# Patient Record
Sex: Male | Born: 1985 | Race: White | Hispanic: No | Marital: Single | Smoking: Current every day smoker
Health system: Southern US, Community
[De-identification: ages and names within clinical notes are randomized; demographics above are authoritative.]

## PROBLEM LIST (undated history)

## (undated) DIAGNOSIS — K219 Gastro-esophageal reflux disease without esophagitis: Secondary | ICD-10-CM

## (undated) DIAGNOSIS — F209 Schizophrenia, unspecified: Secondary | ICD-10-CM

## (undated) DIAGNOSIS — F319 Bipolar disorder, unspecified: Secondary | ICD-10-CM

## (undated) DIAGNOSIS — R441 Visual hallucinations: Secondary | ICD-10-CM

## (undated) DIAGNOSIS — F1910 Other psychoactive substance abuse, uncomplicated: Secondary | ICD-10-CM

## (undated) DIAGNOSIS — F419 Anxiety disorder, unspecified: Secondary | ICD-10-CM

## (undated) HISTORY — PX: NO PAST SURGERIES: SHX2092

## (undated) HISTORY — PX: MULTIPLE TOOTH EXTRACTIONS: SHX2053

---
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
## 2008-11-28 ENCOUNTER — Emergency Department (HOSPITAL_COMMUNITY): Admission: EM | Admit: 2008-11-28 | Discharge: 2008-11-28 | Payer: Self-pay | Admitting: Emergency Medicine

## 2008-12-15 ENCOUNTER — Emergency Department (HOSPITAL_COMMUNITY): Admission: EM | Admit: 2008-12-15 | Discharge: 2008-12-16 | Payer: Self-pay | Admitting: Emergency Medicine

## 2008-12-17 ENCOUNTER — Emergency Department (HOSPITAL_COMMUNITY): Admission: EM | Admit: 2008-12-17 | Discharge: 2008-12-17 | Payer: Self-pay | Admitting: Emergency Medicine

## 2009-02-19 ENCOUNTER — Emergency Department (HOSPITAL_COMMUNITY): Admission: EM | Admit: 2009-02-19 | Discharge: 2009-02-19 | Payer: Self-pay | Admitting: Emergency Medicine

## 2009-02-25 ENCOUNTER — Emergency Department (HOSPITAL_COMMUNITY): Admission: EM | Admit: 2009-02-25 | Discharge: 2009-02-26 | Payer: Self-pay | Admitting: Emergency Medicine

## 2009-11-04 ENCOUNTER — Emergency Department (HOSPITAL_COMMUNITY): Admission: EM | Admit: 2009-11-04 | Discharge: 2009-11-04 | Payer: Self-pay | Admitting: Emergency Medicine

## 2010-11-30 ENCOUNTER — Emergency Department (HOSPITAL_COMMUNITY)
Admission: EM | Admit: 2010-11-30 | Discharge: 2010-12-01 | Disposition: A | Discharge status: Home / Self Care | Payer: Self-pay | Attending: Emergency Medicine | Admitting: Emergency Medicine

## 2010-11-30 DIAGNOSIS — M545 Low back pain, unspecified: Secondary | ICD-10-CM | POA: Insufficient documentation

## 2010-11-30 DIAGNOSIS — M25519 Pain in unspecified shoulder: Secondary | ICD-10-CM | POA: Insufficient documentation

## 2010-11-30 DIAGNOSIS — S0990XA Unspecified injury of head, initial encounter: Secondary | ICD-10-CM | POA: Insufficient documentation

## 2010-11-30 DIAGNOSIS — R0789 Other chest pain: Secondary | ICD-10-CM | POA: Insufficient documentation

## 2010-12-01 ENCOUNTER — Emergency Department (HOSPITAL_COMMUNITY): Payer: Self-pay

## 2010-12-01 LAB — URINALYSIS, ROUTINE W REFLEX MICROSCOPIC
Ketones, ur: 15 mg/dL — AB
Protein, ur: NEGATIVE mg/dL
Urine Glucose, Fasting: NEGATIVE mg/dL
Urobilinogen, UA: 0.2 mg/dL (ref 0.0–1.0)

## 2011-01-17 LAB — URINALYSIS, ROUTINE W REFLEX MICROSCOPIC
Bilirubin Urine: NEGATIVE
Ketones, ur: NEGATIVE mg/dL
Nitrite: NEGATIVE
Protein, ur: NEGATIVE mg/dL

## 2011-01-17 LAB — RAPID URINE DRUG SCREEN, HOSP PERFORMED
Amphetamines: NOT DETECTED
Barbiturates: NOT DETECTED
Benzodiazepines: NOT DETECTED
Benzodiazepines: NOT DETECTED
Cocaine: NOT DETECTED
Tetrahydrocannabinol: POSITIVE — AB

## 2011-01-17 LAB — DIFFERENTIAL
Basophils Absolute: 0 10*3/uL (ref 0.0–0.1)
Basophils Relative: 0 % (ref 0–1)
Basophils Relative: 0 % (ref 0–1)
Eosinophils Relative: 1 % (ref 0–5)
Lymphocytes Relative: 19 % (ref 12–46)
Lymphocytes Relative: 10 % — ABNORMAL LOW (ref 12–46)
Lymphs Abs: 1.3 10*3/uL (ref 0.7–4.0)
Monocytes Absolute: 0.4 10*3/uL (ref 0.1–1.0)
Monocytes Relative: 6 % (ref 3–12)
Neutro Abs: 4.9 10*3/uL (ref 1.7–7.7)
Neutro Abs: 6.9 10*3/uL (ref 1.7–7.7)

## 2011-01-17 LAB — CBC
HCT: 42.7 % (ref 39.0–52.0)
Hemoglobin: 16.3 g/dL (ref 13.0–17.0)
MCHC: 36.5 g/dL — ABNORMAL HIGH (ref 30.0–36.0)
MCHC: 36.7 g/dL — ABNORMAL HIGH (ref 30.0–36.0)
Platelets: 212 10*3/uL (ref 150–400)
RBC: 4.89 MIL/uL (ref 4.22–5.81)
RDW: 13.2 % (ref 11.5–15.5)
WBC: 6.6 10*3/uL (ref 4.0–10.5)

## 2011-01-17 LAB — BASIC METABOLIC PANEL
BUN: 5 mg/dL — ABNORMAL LOW (ref 6–23)
CO2: 32 mEq/L (ref 19–32)
CO2: 30 mEq/L (ref 19–32)
Calcium: 9.9 mg/dL (ref 8.4–10.5)
Calcium: 9.7 mg/dL (ref 8.4–10.5)
GFR calc Af Amer: >60 mL/min (ref >60)
GFR calc non Af Amer: >60 mL/min (ref >60)
Glucose, Bld: 98 mg/dL (ref 70–99)
Sodium: 140 mEq/L (ref 135–145)

## 2011-01-19 LAB — URINALYSIS, ROUTINE W REFLEX MICROSCOPIC
Glucose, UA: NEGATIVE mg/dL
Hgb urine dipstick: NEGATIVE
Ketones, ur: 40 mg/dL — AB
Nitrite: NEGATIVE
Protein, ur: NEGATIVE mg/dL
Specific Gravity, Urine: >1.03 — ABNORMAL HIGH (ref 1.005–1.030)
Urobilinogen, UA: 0.2 mg/dL (ref 0.0–1.0)
pH: 6 (ref 5.0–8.0)

## 2011-01-19 LAB — RAPID URINE DRUG SCREEN, HOSP PERFORMED
Amphetamines: NOT DETECTED
Amphetamines: NOT DETECTED
Barbiturates: NOT DETECTED
Barbiturates: NOT DETECTED
Benzodiazepines: NOT DETECTED
Benzodiazepines: POSITIVE — AB
Cocaine: NOT DETECTED
Cocaine: NOT DETECTED
Opiates: NOT DETECTED
Opiates: POSITIVE — AB
Tetrahydrocannabinol: POSITIVE — AB
Tetrahydrocannabinol: POSITIVE — AB

## 2011-01-19 LAB — BASIC METABOLIC PANEL
CO2: 27 mEq/L (ref 19–32)
Chloride: 97 mEq/L (ref 96–112)
Chloride: 98 mEq/L (ref 96–112)
Creatinine, Ser: 1.15 mg/dL (ref 0.4–1.5)
GFR calc Af Amer: >60 mL/min (ref >60)
GFR calc non Af Amer: >60 mL/min (ref >60)
Glucose, Bld: 111 mg/dL — ABNORMAL HIGH (ref 70–99)
Potassium: 3.7 mEq/L (ref 3.5–5.1)
Potassium: 4.2 mEq/L (ref 3.5–5.1)
Sodium: 137 mEq/L (ref 135–145)

## 2011-01-19 LAB — DIFFERENTIAL
Basophils Absolute: 0 10*3/uL (ref 0.0–0.1)
Basophils Relative: 0 % (ref 0–1)
Basophils Relative: 0 % (ref 0–1)
Eosinophils Absolute: 0 10*3/uL (ref 0.0–0.7)
Eosinophils Absolute: 0.1 10*3/uL (ref 0.0–0.7)
Eosinophils Relative: 0 % (ref 0–5)
Eosinophils Relative: 1 % (ref 0–5)
Lymphocytes Relative: 21 % (ref 12–46)
Lymphs Abs: 1.1 10*3/uL (ref 0.7–4.0)
Lymphs Abs: 1.5 10*3/uL (ref 0.7–4.0)
Monocytes Absolute: 0.4 10*3/uL (ref 0.1–1.0)
Monocytes Absolute: 0.6 10*3/uL (ref 0.1–1.0)
Monocytes Relative: 7 % (ref 3–12)
Monocytes Relative: 8 % (ref 3–12)
Neutro Abs: 3.8 10*3/uL (ref 1.7–7.7)
Neutrophils Relative %: 70 % (ref 43–77)
Neutrophils Relative %: 77 % (ref 43–77)

## 2011-01-19 LAB — CBC
HCT: 45.6 % (ref 39.0–52.0)
HCT: 46 % (ref 39.0–52.0)
Hemoglobin: 16.1 g/dL (ref 13.0–17.0)
Hemoglobin: 16.3 g/dL (ref 13.0–17.0)
MCHC: 35.3 g/dL (ref 30.0–36.0)
MCHC: 35.4 g/dL (ref 30.0–36.0)
MCV: 89.9 fL (ref 78.0–100.0)
MCV: 90.4 fL (ref 78.0–100.0)
Platelets: 226 10*3/uL (ref 150–400)
RBC: 5.07 MIL/uL (ref 4.22–5.81)
RBC: 5.09 MIL/uL (ref 4.22–5.81)
RDW: 13.1 % (ref 11.5–15.5)
WBC: 5.4 10*3/uL (ref 4.0–10.5)
WBC: 8.9 10*3/uL (ref 4.0–10.5)

## 2011-01-19 LAB — ETHANOL
Alcohol, Ethyl (B): <5 mg/dL (ref 0–10)
Alcohol, Ethyl (B): <5 mg/dL (ref 0–10)

## 2011-01-24 LAB — CBC
HCT: 49.6 % (ref 39.0–52.0)
MCV: 91 fL (ref 78.0–100.0)
Platelets: 242 10*3/uL (ref 150–400)
RBC: 5.45 MIL/uL (ref 4.22–5.81)
WBC: 8.4 10*3/uL (ref 4.0–10.5)

## 2011-01-24 LAB — DIFFERENTIAL
Eosinophils Absolute: 0 10*3/uL (ref 0.0–0.7)
Eosinophils Relative: 0 % (ref 0–5)
Lymphs Abs: 1.1 10*3/uL (ref 0.7–4.0)
Monocytes Relative: 5 % (ref 3–12)

## 2011-01-24 LAB — BASIC METABOLIC PANEL
BUN: 9 mg/dL (ref 6–23)
Chloride: 98 mEq/L (ref 96–112)
Potassium: 4.2 mEq/L (ref 3.5–5.1)

## 2011-01-24 LAB — ETHANOL: Alcohol, Ethyl (B): 5 mg/dL (ref 0–10)

## 2011-02-24 NOTE — Discharge Summary (Signed)
   NAME:  Louis Scott, Louis Scott                         ACCOUNT NO.:  [id number]   MEDICAL RECORD NO.:  [id number]                   PATIENT TYPE:  INP   LOCATION:  A328                                 FACILITY:  [facility]   PHYSICIAN:  Donna Bernard, M.D.             DATE OF BIRTH:  1986/05/22   DATE OF ADMISSION:  07/28/2003  DATE OF DISCHARGE:  07/30/2003                                 DISCHARGE SUMMARY   FINAL DIAGNOSS:  1. Exacerbation of asthma.  2. Perihilar pneumonia.   FINAL DISPOSITION:  1. Patient discharged home.  2. Prednisone taper for 8 days.  3. Levaquin daily for 7 days.  4. Ventolin nebulizer treatments q.4h. as needed.  5. Follow up in the office mid-next week.   INITIAL H&P:  See H&P as dictated.   HOSPITAL COURSE:  This patient is a 25 year old white male with a history of  asthma.  He presented to the office, on the day of admission, with  complaints of severe shortness of breath and wheezing.  He had had a  difficult night, up all night, wheezing and coughing.  He had been seen in  the emergency room 36 hours previously.  Due to the patient's overall  distress he is felt to be failure to outpatient management.  He was brought  in the hospital.  Initial O2 saturations were in the 80s.  The patient was  given nasal cannula O2.  He was also given IV fluids, given IV Solu-Medrol  along with IV rocephin.  In addition the patient had nebulizer treatments  frequently.   Over the next 24 hours the patient improved considerably.  Today, the day of  discharge, the patient's O2 saturations are 95% off oxygen.  He has minimal  wheezes this morning despite having had no breathing treatments for the past  8 hours.  I have advised the patient that he is still going to have some  symptoms,  some wheezing and cough, but is stable enough, at this point to head home.   The patient is discharged with the diagnosis and disposition as noted above.     ___________________________________________                                         Donna Bernard, M.D.   WSL/MEDQ  D:  07/30/2003  T:  07/30/2003  Job:  [id number]

## 2011-02-24 NOTE — H&P (Signed)
   NAME:  Louis Scott, Louis Scott                         ACCOUNT NO.:  [id number]   MEDICAL RECORD NO.:  [id number]                   PATIENT TYPE:  INP   LOCATION:  A328                                 FACILITY:  [facility]   PHYSICIAN:  Donna Bernard, M.D.             DATE OF BIRTH:  1986-09-28   DATE OF ADMISSION:  07/28/2003  DATE OF DISCHARGE:                                HISTORY & PHYSICAL   CHIEF COMPLAINT:  Cough, wheezing, short of breath.   SUBJECTIVE:  This patient is a 25 year old white male with a history of  asthma who presents to the office as an emergency work-in on the day of  admission with complaints of serious shortness of breath.  He was seen in  the ER approximately 36 hours ago, given an injection at that time, told to  use albuterol treatments.  He maintained those.  He is not improved.  He has  noted fever off and on and has had cough at times.  Cough is productive at  times.  The patient had a lot of fever and chills last night.  The patient  was up most of the night with shortness of breath and wheezing.  This  morning he is not feeling very well.  His energy level is down.  His  appetite is down.   PRIOR SURGERIES:  1. Remote tube surgery.  2. Remote adenoidectomy.  3. Remote urethral stenosis surgery.   PRIOR MEDICAL HISTORY:  Admission for flare of asthma in 1997.   PRENATAL ANTENATAL HISTORY:  Within normal limits.   Up to date on immunizations.   FAMILY HISTORY:  Positive for asthma.   PERSONAL HABITS:  Unfortunately, smoking.  Recently dropped out of school.  Plans to do GED equivalent soon.   ROS:  Otherwise negative.   PHYSICAL EXAM:  VITAL SIGNS:  Temperature 99.8.  GENERAL:  Alert.  Afebrile.  Mild tachypnea.  HEENT:  Mild nasal congestion.  Pharynx, slight erythema.  NECK:  Supple.  LUNGS:  Positive expiratory wheezes, impressive.  Some rhonchi.  Significant  tachypnea.  Some accessory muscle use.  ABDOMEN:  Soft.  No CVA tenderness.  SKIN:  Normal.  NEUROLOGIC:  Intact.    IMPRESSION:  1. Exacerbation of asthma, unresponsive to outpatient therapy.  2. Pneumonia.   Chest x-ray results back show bronchitic changes along with right middle  lobe and left infrahilar infiltrates.   PLAN:  Rocephin IV.  IV Solu-Medrol.  Frequent nebulizer treatments.  Further orders as noted on the chart.     ___________________________________________                                         Donna Bernard, M.D.   WSL/MEDQ  D:  07/28/2003  T:  07/28/2003  Job:  [id number]

## 2011-05-26 ENCOUNTER — Emergency Department (HOSPITAL_COMMUNITY)
Admission: EM | Admit: 2011-05-26 | Discharge: 2011-05-26 | Disposition: A | Discharge status: Home / Self Care | Payer: Self-pay | Attending: Emergency Medicine | Admitting: Emergency Medicine

## 2011-05-26 ENCOUNTER — Encounter: Payer: Self-pay | Admitting: *Deleted

## 2011-05-26 DIAGNOSIS — J45909 Unspecified asthma, uncomplicated: Secondary | ICD-10-CM | POA: Insufficient documentation

## 2011-05-26 DIAGNOSIS — H5316 Psychophysical visual disturbances: Secondary | ICD-10-CM | POA: Insufficient documentation

## 2011-05-26 DIAGNOSIS — F17200 Nicotine dependence, unspecified, uncomplicated: Secondary | ICD-10-CM | POA: Insufficient documentation

## 2011-05-26 DIAGNOSIS — R441 Visual hallucinations: Secondary | ICD-10-CM

## 2011-05-26 HISTORY — DX: Visual hallucinations: R44.1

## 2011-05-26 LAB — RAPID URINE DRUG SCREEN, HOSP PERFORMED
Amphetamines: NOT DETECTED
Barbiturates: NOT DETECTED
Benzodiazepines: POSITIVE — AB
Cocaine: NOT DETECTED
Opiates: NOT DETECTED
Tetrahydrocannabinol: POSITIVE — AB

## 2011-05-26 LAB — URINALYSIS, ROUTINE W REFLEX MICROSCOPIC
Bilirubin Urine: NEGATIVE
Glucose, UA: NEGATIVE mg/dL
Hgb urine dipstick: NEGATIVE
Ketones, ur: NEGATIVE mg/dL
Protein, ur: NEGATIVE mg/dL

## 2011-05-26 LAB — BASIC METABOLIC PANEL
BUN: 7 mg/dL (ref 6–23)
CO2: 30 mEq/L (ref 19–32)
Calcium: 9.7 mg/dL (ref 8.4–10.5)
Chloride: 103 mEq/L (ref 96–112)
Creatinine, Ser: 0.85 mg/dL (ref 0.50–1.35)
GFR calc Af Amer: >60 mL/min (ref >60)
GFR calc non Af Amer: >60 mL/min (ref >60)
Glucose, Bld: 89 mg/dL (ref 70–99)
Potassium: 3.9 mEq/L (ref 3.5–5.1)
Sodium: 138 mEq/L (ref 135–145)

## 2011-05-26 LAB — CBC
Hemoglobin: 14.3 g/dL (ref 13.0–17.0)
MCHC: 35.9 g/dL (ref 30.0–36.0)
RBC: 4.45 MIL/uL (ref 4.22–5.81)

## 2011-05-26 LAB — DIFFERENTIAL
Basophils Relative: 0 % (ref 0–1)
Monocytes Relative: 7 % (ref 3–12)
Neutro Abs: 5.7 10*3/uL (ref 1.7–7.7)
Neutrophils Relative %: 73 % (ref 43–77)

## 2011-05-26 LAB — ETHANOL: Alcohol, Ethyl (B): <11 mg/dL (ref 0–11)

## 2011-05-26 MED ORDER — LORAZEPAM 1 MG PO TABS: 1.0000 mg | ORAL_TABLET | Freq: Three times a day (TID) | ORAL | Status: AC | PRN

## 2011-05-26 MED ORDER — PALIPERIDONE ER 6 MG PO TB24: 6.0000 mg | ORAL_TABLET | ORAL | Status: DC

## 2011-05-26 NOTE — ED Provider Notes (Signed)
Scribed for Donnetta Hutching, MD, the patient was seen in room 3. This chart was scribed by Jannette Fogo. This patient's care was started at 19:45.   CSN: [id number] Arrival date & time: 05/26/2011  6:26 PM  Chief Complaint  Patient presents with  . Hallucinations    visual hallucinations   HPI Louis Scott is a 25 y.o. male who presents to the Emergency Department for medication refill. Patient has a history of probable schizophrenia treated at [facility] and takes Ativan 1mg  t.i.d and Invera 6mg  qd. He states he ran out of his medication ~2 days ago and since complains of visual hallucinations. Patient reports seeing people in "[place] Jedi robes" and has had "bad nightmares". He denies any associated suicidal or homicidal ideation. He reports a history of similar hallucinating symptoms in the past. There are no other associated symptoms and no other alleviating or aggravating factors.    Past Medical History  Diagnosis Date  . Asthma   . Hallucination, visual   ?schizophrenia   PAST SURGICAL HISTORY:  1. Remote tube surgery.  2. Remote adenoidectomy.  3. Remote urethral stenosis surgery.  FAMILY HISTORY:  No Pertinent Family History  History  Substance Use Topics  . Smoking status: Current Everyday Smoker  . Smokeless tobacco: Not on file  . Alcohol Use: No     Review of Systems  Psychiatric/Behavioral: Positive for hallucinations (+auditory hallucinations ).  All other systems reviewed and are negative.    Physical Exam  BP 126/76  Pulse 86  Temp(Src) 97.7 F (36.5 C) (Oral)  Resp 20  Ht 5\' 6"  (1.676 m)  Wt 130 lb (58.968 kg)  BMI 20.98 kg/m2  SpO2 100%  Physical Exam  Constitutional: He is oriented to person, place, and time. He appears well-developed and well-nourished.  HENT:  Head: Normocephalic and atraumatic.  Neck: Neck supple.  Pulmonary/Chest: Effort normal.  Neurological: He is alert and oriented to person, place, and time. No cranial nerve deficit.    Skin: Skin is dry.  Psychiatric: He has a normal mood and affect. He expresses no homicidal and no suicidal ideation.       Reports +auditory hallucinations     OTHER DATA REVIEWED: Nursing notes, vital signs, and past medical records reviewed.  DIAGNOSTIC STUDIES: Oxygen Saturation is 100% on room air, normal by my interpretation.    LABS: Results for orders placed during the hospital encounter of 05/26/11  URINALYSIS, ROUTINE W REFLEX MICROSCOPIC      Component Value Range   Color, Urine YELLOW  YELLOW    Appearance CLEAR  CLEAR    Specific Gravity, Urine 1.010  1.005 - 1.030    pH 7.0  5.0 - 8.0    Glucose, UA NEGATIVE  NEGATIVE (mg/dL)   Hgb urine dipstick NEGATIVE  NEGATIVE    Bilirubin Urine NEGATIVE  NEGATIVE    Ketones, ur NEGATIVE  NEGATIVE (mg/dL)   Protein, ur NEGATIVE  NEGATIVE (mg/dL)   Urobilinogen, UA 0.2  0.0 - 1.0 (mg/dL)   Nitrite NEGATIVE  NEGATIVE    Leukocytes, UA NEGATIVE  NEGATIVE   URINE RAPID DRUG SCREEN (HOSP PERFORMED)      Component Value Range   Opiates NONE DETECTED  NONE DETECTED    Cocaine NONE DETECTED  NONE DETECTED    Benzodiazepines POSITIVE (*) NONE DETECTED    Amphetamines NONE DETECTED  NONE DETECTED    Tetrahydrocannabinol POSITIVE (*) NONE DETECTED    Barbiturates NONE DETECTED  NONE DETECTED   BASIC  METABOLIC PANEL      Component Value Range   Sodium 138  135 - 145 (mEq/L)   Potassium 3.9  3.5 - 5.1 (mEq/L)   Chloride 103  96 - 112 (mEq/L)   CO2 30  19 - 32 (mEq/L)   Glucose, Bld 89  70 - 99 (mg/dL)   BUN 7  6 - 23 (mg/dL)   Creatinine, Ser 9.60  0.50 - 1.35 (mg/dL)   Calcium 9.7  8.4 - 45.4 (mg/dL)   GFR calc non Af Amer >60  >60 (mL/min)   GFR calc Af Amer >60  >60 (mL/min)  CBC      Component Value Range   WBC 7.8  4.0 - 10.5 (K/uL)   RBC 4.45  4.22 - 5.81 (MIL/uL)   Hemoglobin 14.3  13.0 - 17.0 (g/dL)   HCT 09.8  11.9 - 14.7 (%)   MCV 89.4  78.0 - 100.0 (fL)   MCH 32.1  26.0 - 34.0 (pg)   MCHC 35.9  30.0 - 36.0  (g/dL)   RDW 82.9  56.2 - 13.0 (%)   Platelets 200  150 - 400 (K/uL)  DIFFERENTIAL      Component Value Range   Neutrophils Relative 73  43 - 77 (%)   Neutro Abs 5.7  1.7 - 7.7 (K/uL)   Lymphocytes Relative 18  12 - 46 (%)   Lymphs Abs 1.4  0.7 - 4.0 (K/uL)   Monocytes Relative 7  3 - 12 (%)   Monocytes Absolute 0.5  0.1 - 1.0 (K/uL)   Eosinophils Relative 2  0 - 5 (%)   Eosinophils Absolute 0.2  0.0 - 0.7 (K/uL)   Basophils Relative 0  0 - 1 (%)   Basophils Absolute 0.0  0.0 - 0.1 (K/uL)  ETHANOL      Component Value Range   Alcohol, Ethyl (B) <11  0 - 11 (mg/dL)    ED COURSE / COORDINATION OF CARE: 19:15 - ED physician will supply the patient with a 10 day refill of Ativan 1mg  t.i.d and Invera 6mg  qd.   MDM: Patient is not psychotic. No suicidal or homicidal ideation does not want to be admitted to the hospital. Has outpatient followup. His family to go home to.  IMPRESSION: Diagnoses that have been ruled out:  Diagnoses that are still under consideration:  Final diagnoses:   PLAN:  Home  The patient is to return the emergency department if there is any worsening of symptoms. I have reviewed the discharge instructions with the patient.   CONDITION ON DISCHARGE: Stable  MEDICATIONS GIVEN IN THE E.D.  Medications  paliperidone (INVEGA) 6 MG 24 hr tablet (not administered)  LORazepam (ATIVAN) 1 MG tablet (not administered)  ALBUTEROL IN (not administered)    DISCHARGE MEDICATIONS: New Prescriptions   No medications on file    Procedures   I personally performed the services described in this documentation, which was scribed in my presence. The recorded information has been reviewed and considered. Donnetta Hutching, MD    Donnetta Hutching, MD 05/26/11 2115

## 2011-05-26 NOTE — ED Notes (Signed)
Report given from [place], [place]. Pt reports see things for 12 years. Has been seen at [facility] & placed on meds for the past 2 months. States still seeing things looking for more help. Pt denies any HI/SI. Procedure explained to pt & no questions at this time. Pt provided a warm blanket & the lights turned down no further needs at this time.

## 2011-05-26 NOTE — ED Notes (Signed)
Reports having visual hallucinations x 12 years; states was embarrassed to seek help until recently; was Rx Ativan and Invega for same, but states this is not helping; reports seeing visions of "people dressed in robes" and "large bat-like creatures"; denies suicidal ideation; denies homicidal ideation; pleasant, cooperative, calm.

## 2011-05-26 NOTE — ED Notes (Signed)
MD at bedside. 

## 2011-05-26 NOTE — ED Notes (Signed)
Pt states he has been having dreams about the end of the world and visual hallucinations of seeing people in [place]

## 2011-05-26 NOTE — ED Notes (Signed)
Pt resting calmly w/ eyes closed. Rise & fall of the chest noted. NAD noted at this time.   

## 2011-05-26 NOTE — ED Notes (Signed)
Went to check patients vitals he is sleeping at this time. Checked with nurse he stated to let him rest.

## 2011-06-29 ENCOUNTER — Encounter (HOSPITAL_COMMUNITY): Payer: Self-pay | Admitting: *Deleted

## 2011-06-29 ENCOUNTER — Emergency Department (HOSPITAL_COMMUNITY)
Admission: EM | Admit: 2011-06-29 | Discharge: 2011-06-29 | Disposition: A | Discharge status: Home / Self Care | Payer: BC Managed Care – PPO | Attending: Emergency Medicine | Admitting: Emergency Medicine

## 2011-06-29 DIAGNOSIS — R443 Hallucinations, unspecified: Secondary | ICD-10-CM | POA: Insufficient documentation

## 2011-06-29 DIAGNOSIS — F209 Schizophrenia, unspecified: Secondary | ICD-10-CM | POA: Insufficient documentation

## 2011-06-29 HISTORY — DX: Schizophrenia, unspecified: F20.9

## 2011-06-29 LAB — COMPREHENSIVE METABOLIC PANEL
ALT: 11 U/L (ref 0–53)
AST: 19 U/L (ref 0–37)
Alkaline Phosphatase: 63 U/L (ref 39–117)
CO2: 27 mEq/L (ref 19–32)
Chloride: 102 mEq/L (ref 96–112)
GFR calc Af Amer: >60 mL/min (ref >60)
GFR calc non Af Amer: >60 mL/min (ref >60)
Glucose, Bld: 94 mg/dL (ref 70–99)
Potassium: 4 mEq/L (ref 3.5–5.1)
Sodium: 139 mEq/L (ref 135–145)
Total Bilirubin: 0.2 mg/dL — ABNORMAL LOW (ref 0.3–1.2)

## 2011-06-29 LAB — DIFFERENTIAL
Eosinophils Relative: 3 % (ref 0–5)
Lymphocytes Relative: 29 % (ref 12–46)
Lymphs Abs: 2 10*3/uL (ref 0.7–4.0)
Neutro Abs: 4 10*3/uL (ref 1.7–7.7)
Neutrophils Relative %: 59 % (ref 43–77)

## 2011-06-29 LAB — CBC
MCV: 88.2 fL (ref 78.0–100.0)
Platelets: 239 10*3/uL (ref 150–400)
RBC: 5.26 MIL/uL (ref 4.22–5.81)
WBC: 6.8 10*3/uL (ref 4.0–10.5)

## 2011-06-29 LAB — RAPID URINE DRUG SCREEN, HOSP PERFORMED
Amphetamines: NOT DETECTED
Barbiturates: NOT DETECTED
Tetrahydrocannabinol: NOT DETECTED

## 2011-06-29 MED ORDER — LORAZEPAM 1 MG PO TABS
1.0000 mg | ORAL_TABLET | Freq: Once | ORAL | Status: AC
  Administered 2011-06-29: 1 mg via ORAL
  Filled 2011-06-29: qty 1

## 2011-06-29 MED ORDER — LORAZEPAM 1 MG PO TABS: 1.0000 mg | ORAL_TABLET | Freq: Three times a day (TID) | ORAL | Status: AC | PRN

## 2011-06-29 MED ORDER — PALIPERIDONE ER 6 MG PO TB24: 6.0000 mg | ORAL_TABLET | ORAL | Status: DC

## 2011-06-29 MED ORDER — PALIPERIDONE ER 6 MG PO TB24
6.0000 mg | ORAL_TABLET | Freq: Every day | ORAL | Status: DC
  Administered 2011-06-29: 6 mg via ORAL

## 2011-06-29 NOTE — ED Provider Notes (Signed)
History     CSN: [id number] Arrival date & time: 06/29/2011  4:30 AM   Chief Complaint  Patient presents with  . Hallucinations     (Include location/radiation/quality/duration/timing/severity/associated sxs/prior treatment) HPI Comments: Seen 46  Patient is a 25 y.o. male presenting with mental health disorder. The history is provided by the patient.  Mental Health Problem The primary symptoms include delusions and hallucinations. The current episode started this week.  The delusions began this week. He has delusions of persecution (being chased).  The hallucinations began this week. The hallucinations appear to have been unchanged since their onset. He has auditory and visual (seing warriors) hallucinations.  Precipitated by: Running out of medicines. The degree of incapacity that he is experiencing as a consequence of his illness is mild. Additional symptoms of the illness include anhedonia and insomnia. He does not admit to suicidal ideas. He does not have a plan to commit suicide. He does not contemplate harming himself. He has not already injured self. He does not contemplate injuring another person. Risk factors that are present for mental illness include a history of mental illness.     Past Medical History  Diagnosis Date  . Asthma   . Hallucination, visual   . Schizophrenic disorder      History reviewed. No pertinent past surgical history.  History reviewed. No pertinent family history.  History  Substance Use Topics  . Smoking status: Current Everyday Smoker -- 1.0 packs/day    Types: Cigarettes  . Smokeless tobacco: Not on file  . Alcohol Use: Yes     admits to drinking tonight      Review of Systems  Psychiatric/Behavioral: Positive for hallucinations. The patient has insomnia.   All other systems reviewed and are negative.    Allergies  Review of patient's allergies indicates no known allergies.  Home Medications   Current Outpatient Rx  Name  Route Sig Dispense Refill  . ALBUTEROL IN Inhalation Inhale 2 puffs into the lungs 2 (two) times daily as needed. Shortness of breath and wheezing     . LORAZEPAM 1 MG PO TABS Oral Take 1 mg by mouth every 8 (eight) hours.      Marland Kitchen PALIPERIDONE 6 MG PO TB24 Oral Take 6 mg by mouth every morning.        Physical Exam    BP 143/87  Pulse 105  Temp(Src) 98.7 F (37.1 C) (Oral)  Resp 18  Ht 5\' 6"  (1.676 m)  Wt 140 lb (63.504 kg)  BMI 22.60 kg/m2  SpO2 99%  Physical Exam  Nursing note and vitals reviewed. Constitutional: He appears well-developed and well-nourished.  HENT:  Head: Normocephalic and atraumatic.  Mouth/Throat: Oropharynx is clear and moist.  Eyes: EOM are normal. Pupils are equal, round, and reactive to light.  Neck: Normal range of motion. Neck supple.  Cardiovascular: Normal rate, normal heart sounds and intact distal pulses.   Pulmonary/Chest: Effort normal and breath sounds normal.  Abdominal: Soft. Bowel sounds are normal.    ED Course  Procedures  Results for orders placed during the hospital encounter of 06/29/11  CBC      Component Value Range   WBC 6.8  4.0 - 10.5 (K/uL)   RBC 5.26  4.22 - 5.81 (MIL/uL)   Hemoglobin 17.0  13.0 - 17.0 (g/dL)   HCT 30.8  65.7 - 84.6 (%)   MCV 88.2  78.0 - 100.0 (fL)   MCH 32.3  26.0 - 34.0 (pg)   MCHC 36.6 (*)  30.0 - 36.0 (g/dL)   RDW 09.8  11.9 - 14.7 (%)   Platelets 239  150 - 400 (K/uL)  DIFFERENTIAL      Component Value Range   Neutrophils Relative 59  43 - 77 (%)   Neutro Abs 4.0  1.7 - 7.7 (K/uL)   Lymphocytes Relative 29  12 - 46 (%)   Lymphs Abs 2.0  0.7 - 4.0 (K/uL)   Monocytes Relative 9  3 - 12 (%)   Monocytes Absolute 0.6  0.1 - 1.0 (K/uL)   Eosinophils Relative 3  0 - 5 (%)   Eosinophils Absolute 0.2  0.0 - 0.7 (K/uL)   Basophils Relative 0  0 - 1 (%)   Basophils Absolute 0.0  0.0 - 0.1 (K/uL)  COMPREHENSIVE METABOLIC PANEL      Component Value Range   Sodium 139  135 - 145 (mEq/L)   Potassium  4.0  3.5 - 5.1 (mEq/L)   Chloride 102  96 - 112 (mEq/L)   CO2 27  19 - 32 (mEq/L)   Glucose, Bld 94  70 - 99 (mg/dL)   BUN 9  6 - 23 (mg/dL)   Creatinine, Ser 8.29  0.50 - 1.35 (mg/dL)   Calcium 9.6  8.4 - 56.2 (mg/dL)   Total Protein 7.3  6.0 - 8.3 (g/dL)   Albumin 4.2  3.5 - 5.2 (g/dL)   AST 19  0 - 37 (U/L)   ALT 11  0 - 53 (U/L)   Alkaline Phosphatase 63  39 - 117 (U/L)   Total Bilirubin 0.2 (*) 0.3 - 1.2 (mg/dL)   GFR calc non Af Amer >60  >60 (mL/min)   GFR calc Af Amer >60  >60 (mL/min)  ETHANOL      Component Value Range   Alcohol, Ethyl (B) 42 (*) 0 - 11 (mg/dL)  ACETAMINOPHEN LEVEL      Component Value Range   Acetaminophen (Tylenol), Serum <15.0  10 - 30 (ug/mL)   Patient with h/o ? Schizophrenia out of medicines for 2 weeks. Next appointment with MD at [facility] is 07/04/11. Unable to fill meds until then. States he is having auditory and visual hallucinations Deneis suicidal ideation or homicidal ideation..Administered meds in the ER and will send home with Rx for 5 days of each. He is to follow up at [facility].Patient understand and agree with initial ED impression and plan with expectations set for ED visit. MDM Reviewed: nursing note, vitals and previous chart         Nicoletta Dress. Colon Branch, MD 06/29/11 [phone]

## 2011-06-29 NOTE — ED Notes (Addendum)
Pt states see people in robes & they are telling him the world is going to end if he does not go to [place]. Pt was seen in the er in august & was to follow up w/ [facility] & did not call next appt is the 25 of this month. Pt his been out of meds for the past 2 weeks. Pt has the smell of etoh & pt admits to have been drinking tonight. Pt denies any SI/HI.

## 2011-06-29 NOTE — ED Notes (Signed)
AC called for invega tablet.

## 2011-06-29 NOTE — ED Notes (Addendum)
Patient ran out of meds for schizophrenia for 2 weeks, hears voices and sees things, denies SI/HI

## 2011-06-29 NOTE — ED Notes (Signed)
Pt given water to encourage pt for a urine sample.

## 2011-07-12 ENCOUNTER — Encounter (HOSPITAL_COMMUNITY): Payer: Self-pay

## 2011-07-12 ENCOUNTER — Emergency Department (HOSPITAL_COMMUNITY)
Admission: EM | Admit: 2011-07-12 | Discharge: 2011-07-12 | Disposition: A | Discharge status: Home / Self Care | Payer: BC Managed Care – PPO | Attending: Emergency Medicine | Admitting: Emergency Medicine

## 2011-07-12 DIAGNOSIS — Z79899 Other long term (current) drug therapy: Secondary | ICD-10-CM | POA: Insufficient documentation

## 2011-07-12 DIAGNOSIS — F17200 Nicotine dependence, unspecified, uncomplicated: Secondary | ICD-10-CM | POA: Insufficient documentation

## 2011-07-12 DIAGNOSIS — F209 Schizophrenia, unspecified: Secondary | ICD-10-CM

## 2011-07-12 LAB — RAPID URINE DRUG SCREEN, HOSP PERFORMED
Barbiturates: NOT DETECTED
Benzodiazepines: POSITIVE — AB

## 2011-07-12 LAB — CBC
Hemoglobin: 17.3 g/dL — ABNORMAL HIGH (ref 13.0–17.0)
Platelets: 256 10*3/uL (ref 150–400)
RBC: 5.44 MIL/uL (ref 4.22–5.81)

## 2011-07-12 LAB — COMPREHENSIVE METABOLIC PANEL
ALT: 12 U/L (ref 0–53)
AST: 28 U/L (ref 0–37)
Alkaline Phosphatase: 61 U/L (ref 39–117)
CO2: 31 mEq/L (ref 19–32)
Calcium: 10.1 mg/dL (ref 8.4–10.5)
GFR calc Af Amer: >90 mL/min (ref >90)
Glucose, Bld: 130 mg/dL — ABNORMAL HIGH (ref 70–99)
Potassium: 4.4 mEq/L (ref 3.5–5.1)
Sodium: 137 mEq/L (ref 135–145)
Total Protein: 7 g/dL (ref 6.0–8.3)

## 2011-07-12 MED ORDER — PALIPERIDONE ER 6 MG PO TB24
6.0000 mg | ORAL_TABLET | Freq: Every day | ORAL | Status: DC
  Administered 2011-07-12: 6 mg via ORAL
  Filled 2011-07-12 (×2): qty 1

## 2011-07-12 MED ORDER — LORAZEPAM 1 MG PO TABS
1.0000 mg | ORAL_TABLET | Freq: Once | ORAL | Status: AC
  Administered 2011-07-12: 1 mg via ORAL
  Filled 2011-07-12: qty 1

## 2011-07-12 NOTE — ED Notes (Signed)
Placed in paper scrubs and wanded by security for safety

## 2011-07-12 NOTE — ED Provider Notes (Signed)
History     CSN: [id number] Arrival date & time: 07/12/2011  4:34 AM  Chief Complaint  Patient presents with  . Medical Clearance    (Consider location/radiation/quality/duration/timing/severity/associated sxs/prior treatment) HPI Comments: Patient is a very pleasant 25 year old male that has a history of schizophrenia. He was diagnosed with this approximately 8 months ago. However he states that he has been having hallucinations for the last 13 years which has gradually gotten worse. He states that his hallucinations are both visual and audio.  As a child he felt like he used to see Jesus appearing but this no longer happens. He states that now he sees people who are dressed up in long Robes who he believes to be months from [place]. He believes that unless he goes to [place] to climb the mountain,  that he cannot save the world.  He also believes that he should not eat meat because amongst her telling them not to eat meat because it is required by telling animals which is wrong. He states that he eats one meal a day which is usually a salad, has lost appetite, sleeps poorly and spends most of his day staring at a wall. He states that he cannot play video games, watch computer her television, listen to music because of his hallucinations which have become quite intense. He denies suicidal thoughts but admits to ongoing depression because of the difficulty controlling his hallucinations. He states that he was supposed to followup with [facility], but when he arrived for his appointment on the 25th they told him that the physician had quit and he was rescheduled for 2 months later. He has not had his medications including Ativan or invega in over 2 weeks.  She denies physical complaints including fever, chest pain, headache, blurred vision, weakness, numbness, abdominal or back pain.  The history is provided by the patient and medical records.    Past Medical History  Diagnosis Date  . Asthma   .  Hallucination, visual   . Schizophrenic disorder     History reviewed. No pertinent past surgical history.  No family history on file.  History  Substance Use Topics  . Smoking status: Current Everyday Smoker -- 1.0 packs/day    Types: Cigarettes  . Smokeless tobacco: Not on file  . Alcohol Use: Yes      Review of Systems  All other systems reviewed and are negative.    Allergies  Review of patient's allergies indicates no known allergies.  Home Medications   Current Outpatient Rx  Name Route Sig Dispense Refill  . ALBUTEROL IN Inhalation Inhale 2 puffs into the lungs 2 (two) times daily as needed. Shortness of breath and wheezing     . LORAZEPAM 1 MG PO TABS Oral Take 1 mg by mouth every 8 (eight) hours.      Marland Kitchen PALIPERIDONE 6 MG PO TB24 Oral Take 6 mg by mouth every morning.      Marland Kitchen PALIPERIDONE 6 MG PO TB24 Oral Take 1 tablet (6 mg total) by mouth every morning. 5 tablet 0    BP 130/108  Pulse 108  Temp(Src) 98.2 F (36.8 C) (Oral)  Resp 20  Ht 5\' 5"  (1.651 m)  Wt 140 lb (63.504 kg)  BMI 23.30 kg/m2  SpO2 100%  Physical Exam  Nursing note and vitals reviewed. Constitutional: He appears well-developed and well-nourished. No distress.  HENT:  Head: Normocephalic and atraumatic.  Mouth/Throat: Oropharynx is clear and moist. No oropharyngeal exudate.  Eyes: Conjunctivae and  EOM are normal. Pupils are equal, round, and reactive to light. Right eye exhibits no discharge. Left eye exhibits no discharge. No scleral icterus.  Neck: Normal range of motion. Neck supple. No JVD present. No thyromegaly present.  Cardiovascular: Normal rate, regular rhythm, normal heart sounds and intact distal pulses.  Exam reveals no gallop and no friction rub.   No murmur heard. Pulmonary/Chest: Effort normal and breath sounds normal. No respiratory distress. He has no wheezes. He has no rales.  Abdominal: Soft. Bowel sounds are normal. He exhibits no distension and no mass. There is no  tenderness.  Musculoskeletal: Normal range of motion. He exhibits no edema and no tenderness.  Lymphadenopathy:    He has no cervical adenopathy.  Neurological: He is alert. Coordination normal.  Skin: Skin is warm and dry. No rash noted. No erythema.  Psychiatric: He has a normal mood and affect.       Patient was active hallucinations, denying suicidal thoughts, some paranoia about other people knowing what he is thinking.    ED Course  Procedures (including critical care time)   Labs Reviewed  CBC  COMPREHENSIVE METABOLIC PANEL  ETHANOL  URINE RAPID DRUG SCREEN (HOSP PERFORMED)   No results found.   No diagnosis found.    MDM  Patient has ongoing active schizophrenic symptoms including hallucinations and decline in his ability to care for himself. He has no suicidal thoughts and is not a harm or danger to himself or others. He definitely would benefit from medication treatment for his schizophrenia. I have ordered Ativan and in Vega for him while he is in the emergency department. Anticipate behavioral health consult in the morning        Vida Roller, MD 07/13/11 [phone]

## 2011-07-12 NOTE — ED Provider Notes (Signed)
The patient was seen by Tommy from [facility].  Marland Kitchen  He remains without any suicidal or homicidal ideation.  Timing has range for him to his medications as soon as tomorrow.  The patient is except a bolster the plan of going home and getting his meds tomorrow.  He knows that he can return to the emergency department if his symptoms progress or you feel like he hurt himself or anybody else.  Nicholes Stairs, MD 07/12/11 1048

## 2011-07-12 NOTE — ED Notes (Signed)
[email or web address] to bedside for exam, stated he did not need a sitter for safety

## 2011-07-12 NOTE — ED Notes (Signed)
Pt ambulated to restroom with no difficulty.  Unable to urinate at present time.

## 2011-07-12 NOTE — ED Notes (Signed)
Pt reports being out of medication for about 1 month.  States that he does hear voices.  Denies thoughts of suicide and has no specific plan.  Dr Hyacinth Meeker at bedside.  Per Dr. Hyacinth Meeker, pt does not require suicide precautions or sitter at this time.

## 2011-07-12 NOTE — ED Notes (Signed)
Pt reports having hallucinations and delusions for 8 months, "my doctor quit" and out of meds for 1 month, denies si/hi, hearing voices to "got to [place] because the world it ending"

## 2011-08-03 ENCOUNTER — Emergency Department (HOSPITAL_COMMUNITY)
Admission: EM | Admit: 2011-08-03 | Discharge: 2011-08-03 | Disposition: A | Discharge status: Home / Self Care | Payer: BC Managed Care – PPO | Attending: Emergency Medicine | Admitting: Emergency Medicine

## 2011-08-03 ENCOUNTER — Encounter (HOSPITAL_COMMUNITY): Payer: Self-pay | Admitting: *Deleted

## 2011-08-03 DIAGNOSIS — Z79899 Other long term (current) drug therapy: Secondary | ICD-10-CM | POA: Insufficient documentation

## 2011-08-03 DIAGNOSIS — F21 Schizotypal disorder: Secondary | ICD-10-CM

## 2011-08-03 DIAGNOSIS — F22 Delusional disorders: Secondary | ICD-10-CM

## 2011-08-03 DIAGNOSIS — F17200 Nicotine dependence, unspecified, uncomplicated: Secondary | ICD-10-CM | POA: Insufficient documentation

## 2011-08-03 MED ORDER — QUETIAPINE FUMARATE 100 MG PO TABS: 100.0000 mg | ORAL_TABLET | Freq: Every day | ORAL | Status: DC

## 2011-08-03 NOTE — ED Notes (Signed)
Voices telling him the world is coming to an end in December and he needs to get to [place].

## 2011-08-03 NOTE — ED Notes (Addendum)
Pt is being seen at [facility], next appt. Nov 21st. Pt reports seeing & hearing things. Has been on meds for 4 months & not getting any better. Has been seen here in the er for the same. Pt states he is seeing people in robes & bat like creatures that is telling him the world is coming to a end.

## 2011-08-03 NOTE — ED Provider Notes (Signed)
History     CSN: [id number] Arrival date & time: 08/03/2011  4:38 AM   First MD Initiated Contact with Patient 08/03/11 [phone]      Chief Complaint  Patient presents with  . Medical Clearance    (Consider location/radiation/quality/duration/timing/severity/associated sxs/prior treatment) HPI Comments: Patient is a very nice 25 year old male with history of schizophrenia-type disorder who presents to the emergency department with ongoing hallucinations and delusions. He adamantly believes that he is the only person that can save the world though he is unsure what he is sitting and from and how he is going to see if it. In the past he has felt like he had to climb a mountain into that and that monks wearing robes were talking to him. He has both visual and auditory hallucinations. He denies any depression or suicidal thoughts and has no homicidal thoughts. He states that he very occasionally smokes marijuana, occasionally drinks alcohol and smokes cigarettes everyday. He denies any physical complaints. He states that since he was last seen in the emergency department 3 weeks ago his symptoms have been constant, not getting better or worse, nothing makes them better or worse, and not associated with suicidal thoughts. He has been following up with [facility] but states that he feels his medications are not helping him. He takes both [place] and Ativan  The history is provided by the patient and medical records.    Past Medical History  Diagnosis Date  . Asthma   . Hallucination, visual   . Schizophrenic disorder     History reviewed. No pertinent past surgical history.  History reviewed. No pertinent family history.  History  Substance Use Topics  . Smoking status: Current Everyday Smoker -- 1.0 packs/day    Types: Cigarettes  . Smokeless tobacco: Not on file  . Alcohol Use: Yes      Review of Systems  All other systems reviewed and are negative.    Allergies  Review of patient's  allergies indicates no known allergies.  Home Medications   Current Outpatient Rx  Name Route Sig Dispense Refill  . ALBUTEROL SULFATE HFA 108 (90 BASE) MCG/ACT IN AERS Inhalation Inhale 2 puffs into the lungs every 6 (six) hours as needed. Wheezing/Shortness of Breath     . ALBUTEROL IN Inhalation Inhale 2 puffs into the lungs 2 (two) times daily as needed. Shortness of breath and wheezing    . LORAZEPAM 1 MG PO TABS Oral Take 1 mg by mouth 3 (three) times daily.     Marland Kitchen PALIPERIDONE 6 MG PO TB24 Oral Take 6 mg by mouth every morning.      Marland Kitchen QUETIAPINE FUMARATE 100 MG PO TABS Oral Take 1 tablet (100 mg total) by mouth at bedtime. 30 tablet 0    BP 142/94  Pulse 82  Temp(Src) 98.6 F (37 C) (Oral)  Resp 16  Ht 5\' 5"  (1.651 m)  Wt 140 lb (63.504 kg)  BMI 23.30 kg/m2  SpO2 99%  Physical Exam  Nursing note and vitals reviewed. Constitutional: He appears well-developed and well-nourished. No distress.  HENT:  Head: Normocephalic and atraumatic.  Mouth/Throat: Oropharynx is clear and moist. No oropharyngeal exudate.  Eyes: Conjunctivae and EOM are normal. Pupils are equal, round, and reactive to light. Right eye exhibits no discharge. Left eye exhibits no discharge. No scleral icterus.  Neck: Normal range of motion. Neck supple. No JVD present. No thyromegaly present.  Cardiovascular: Normal rate, regular rhythm, normal heart sounds and intact distal pulses.  Exam reveals no gallop and no friction rub.   No murmur heard. Pulmonary/Chest: Effort normal and breath sounds normal. No respiratory distress. He has no wheezes. He has no rales.  Abdominal: Soft. Bowel sounds are normal. He exhibits no distension and no mass. There is no tenderness.  Musculoskeletal: Normal range of motion. He exhibits no edema and no tenderness.  Lymphadenopathy:    He has no cervical adenopathy.  Neurological: He is alert. Coordination normal.  Skin: Skin is warm and dry. No rash noted. He is not  diaphoretic. No erythema.  Psychiatric:       Hallucinations and delusions present, no suicidal thoughts, normal mental status otherwise    ED Course  Procedures (including critical care time)  Labs Reviewed - No data to display No results found.   1. Delusional disorder   2. Schizotypal personality disorder       MDM  Ongoing hallucinations poorly controlled on home medications. The patient does not pose a risk to himself or others however and is able to eat drink and take care of himself at home. He is currently living with his grandfather and feels that this is a safe environment. Will do a [facility] psych consult to evaluate for possible medication changes  Discussed with psychiatrist who made several suggestions - he believes that the patient has a fixed delusional disorder and a schizotypal personality disorder  #1 patient would benefit from psychotherapy #2 has good insight and safe environment and thus does not qualify for inpatient admission  #3. Integument  #4 start Seroquel 100 mg by mouth every night  #5 continue Ativan 1 mg by mouth 3 times a day.      Vida Roller, MD 08/03/11 [phone]

## 2011-08-03 NOTE — ED Notes (Signed)
Pt interviewing via streaming video.

## 2011-08-03 NOTE — ED Notes (Signed)
Pt calm and compliant with all cares completing his tele-psych visit.

## 2011-08-08 ENCOUNTER — Emergency Department (HOSPITAL_COMMUNITY)
Admission: EM | Admit: 2011-08-08 | Discharge: 2011-08-09 | Disposition: A | Discharge status: Other Acute Inpatient Hospital | Payer: BC Managed Care – PPO | Attending: Emergency Medicine | Admitting: Emergency Medicine

## 2011-08-08 ENCOUNTER — Encounter (HOSPITAL_COMMUNITY): Payer: Self-pay | Admitting: Emergency Medicine

## 2011-08-08 DIAGNOSIS — F29 Unspecified psychosis not due to a substance or known physiological condition: Secondary | ICD-10-CM | POA: Insufficient documentation

## 2011-08-08 DIAGNOSIS — F17200 Nicotine dependence, unspecified, uncomplicated: Secondary | ICD-10-CM | POA: Insufficient documentation

## 2011-08-08 DIAGNOSIS — F209 Schizophrenia, unspecified: Secondary | ICD-10-CM | POA: Insufficient documentation

## 2011-08-08 DIAGNOSIS — J45909 Unspecified asthma, uncomplicated: Secondary | ICD-10-CM | POA: Insufficient documentation

## 2011-08-08 LAB — CBC
HCT: 47.8 % (ref 39.0–52.0)
MCH: 32.2 pg (ref 26.0–34.0)
MCHC: 36.4 g/dL — ABNORMAL HIGH (ref 30.0–36.0)
MCV: 88.4 fL (ref 78.0–100.0)
Platelets: 250 10*3/uL (ref 150–400)
RDW: 12.6 % (ref 11.5–15.5)
WBC: 8.7 10*3/uL (ref 4.0–10.5)

## 2011-08-08 LAB — ETHANOL: Alcohol, Ethyl (B): <11 mg/dL (ref 0–11)

## 2011-08-08 LAB — RAPID URINE DRUG SCREEN, HOSP PERFORMED
Amphetamines: NOT DETECTED
Benzodiazepines: NOT DETECTED
Cocaine: NOT DETECTED
Opiates: NOT DETECTED

## 2011-08-08 LAB — BASIC METABOLIC PANEL
CO2: 26 mEq/L (ref 19–32)
Calcium: 10.3 mg/dL (ref 8.4–10.5)
Creatinine, Ser: 0.98 mg/dL (ref 0.50–1.35)
GFR calc non Af Amer: >90 mL/min (ref >90)
Sodium: 139 mEq/L (ref 135–145)

## 2011-08-08 LAB — DIFFERENTIAL
Basophils Absolute: 0 10*3/uL (ref 0.0–0.1)
Eosinophils Absolute: 0.1 10*3/uL (ref 0.0–0.7)
Eosinophils Relative: 1 % (ref 0–5)
Lymphocytes Relative: 12 % (ref 12–46)
Monocytes Absolute: 0.5 10*3/uL (ref 0.1–1.0)

## 2011-08-08 MED ORDER — ACETAMINOPHEN 325 MG PO TABS: 650.0000 mg | ORAL_TABLET | ORAL | Status: DC | PRN

## 2011-08-08 MED ORDER — NICOTINE 21 MG/24HR TD PT24: 21.0000 mg | MEDICATED_PATCH | Freq: Every day | TRANSDERMAL | Status: DC | PRN

## 2011-08-08 MED ORDER — ALUM & MAG HYDROXIDE-SIMETH 200-200-20 MG/5ML PO SUSP: 30.0000 mL | ORAL | Status: DC | PRN

## 2011-08-08 MED ORDER — IBUPROFEN 400 MG PO TABS: 400.0000 mg | ORAL_TABLET | Freq: Three times a day (TID) | ORAL | Status: DC | PRN

## 2011-08-08 MED ORDER — LORAZEPAM 1 MG PO TABS
1.0000 mg | ORAL_TABLET | Freq: Three times a day (TID) | ORAL | Status: DC | PRN
  Administered 2011-08-09: 1 mg via ORAL
  Filled 2011-08-08: qty 1

## 2011-08-08 MED ORDER — ONDANSETRON HCL 4 MG PO TABS: 4.0000 mg | ORAL_TABLET | Freq: Three times a day (TID) | ORAL | Status: DC | PRN

## 2011-08-08 MED ORDER — ZOLPIDEM TARTRATE 5 MG PO TABS: 5.0000 mg | ORAL_TABLET | Freq: Every evening | ORAL | Status: DC | PRN

## 2011-08-08 NOTE — ED Notes (Signed)
Pt states he is having hallucinations. Pt states he is seeing people in [place] and aliens. Pt states he cannot take it anymore. Pt denies any hi/si thoughts at this time.

## 2011-08-08 NOTE — ED Provider Notes (Signed)
History     CSN: [id number] Arrival date & time: 08/08/2011  7:01 PM    Chief Complaint  Patient presents with  . Hallucinations  . Medical Clearance    The history is provided by the patient. History Limited By: psychosis.  Pt was seen at [facility].  Per pt, c/o gradual onset and worsening of persistent hallucinations and delusions of "seeing people in robes" and "aliens" for the past several weeks, worse over the past several days.  States he "just can't take it anymore."  Was eval in ED 5d ago for same, meds adjusted, but pt states he "can't afford them" and has not been taking his meds as prescribed/adjusted 5d ago.  Denies SI, no HI.  Denies any other complaints.    Past Medical History  Diagnosis Date  . Asthma   . Hallucination, visual   . Schizophrenic disorder     History reviewed. No pertinent past surgical history.  History reviewed. No pertinent family history.  History  Substance Use Topics  . Smoking status: Current Everyday Smoker -- 1.0 packs/day    Types: Cigarettes  . Smokeless tobacco: Not on file  . Alcohol Use: Yes      Review of Systems  Unable to perform ROS: Psychiatric disorder    Allergies  Review of patient's allergies indicates no known allergies.  Home Medications   Current Outpatient Rx  Name Route Sig Dispense Refill  . ALBUTEROL SULFATE HFA 108 (90 BASE) MCG/ACT IN AERS Inhalation Inhale 2 puffs into the lungs every 6 (six) hours as needed. Wheezing/Shortness of Breath     . CHLORPHENIRAMINE-DM 4-30 MG PO TABS Oral Take 1 capsule by mouth as needed. For cough     . LORAZEPAM 1 MG PO TABS Oral Take 1 mg by mouth 3 (three) times daily.     Marland Kitchen PALIPERIDONE 6 MG PO TB24 Oral Take 6 mg by mouth every morning.        BP 151/86  Pulse 109  Temp(Src) 97.6 F (36.4 C) (Oral)  Resp 20  Ht 5\' 5"  (1.651 m)  Wt 140 lb (63.504 kg)  BMI 23.30 kg/m2  SpO2 97%  Physical Exam 1955: Physical examination:  Nursing notes reviewed; Vital signs and  O2 SAT reviewed;  Constitutional: Well developed, Well nourished, Well hydrated, In no acute distress; Head:  Normocephalic, atraumatic; Eyes: EOMI, PERRL, No scleral icterus; ENMT: Mouth and pharynx normal, Mucous membranes moist; Neck: Supple, Full range of motion, No lymphadenopathy; Cardiovascular: Regular rate and rhythm, No murmur, rub, or gallop; Respiratory: Breath sounds clear & equal bilaterally, No rales, rhonchi, wheezes, or rub, Normal respiratory effort/excursion; Chest: Nontender, Movement normal; Extremities: Pulses normal, No tenderness, No edema, No calf edema or asymmetry.; Neuro: AA&Ox3, Major CN grossly intact.  No gross focal motor or sensory deficits in extremities.; Skin: Color normal, Warm, Dry.  Psych:  Affect flat, poor eye contact, +hallucinations.  No SI.     ED Course  Procedures   MDM  MDM Reviewed: previous chart, nursing note and vitals Interpretation: labs   Results for orders placed during the hospital encounter of 08/08/11  ETHANOL      Component Value Range   Alcohol, Ethyl (B) <11  0 - 11 (mg/dL)  URINE RAPID DRUG SCREEN (HOSP PERFORMED)      Component Value Range   Opiates NONE DETECTED  NONE DETECTED    Cocaine NONE DETECTED  NONE DETECTED    Benzodiazepines NONE DETECTED  NONE DETECTED    Amphetamines  NONE DETECTED  NONE DETECTED    Tetrahydrocannabinol NONE DETECTED  NONE DETECTED    Barbiturates NONE DETECTED  NONE DETECTED   CBC      Component Value Range   WBC 8.7  4.0 - 10.5 (K/uL)   RBC 5.41  4.22 - 5.81 (MIL/uL)   Hemoglobin 17.4 (*) 13.0 - 17.0 (g/dL)   HCT 62.1  30.8 - 65.7 (%)   MCV 88.4  78.0 - 100.0 (fL)   MCH 32.2  26.0 - 34.0 (pg)   MCHC 36.4 (*) 30.0 - 36.0 (g/dL)   RDW 84.6  96.2 - 95.2 (%)   Platelets 250  150 - 400 (K/uL)  DIFFERENTIAL      Component Value Range   Neutrophils Relative 82 (*) 43 - 77 (%)   Neutro Abs 7.1  1.7 - 7.7 (K/uL)   Lymphocytes Relative 12  12 - 46 (%)   Lymphs Abs 1.1  0.7 - 4.0 (K/uL)    Monocytes Relative 5  3 - 12 (%)   Monocytes Absolute 0.5  0.1 - 1.0 (K/uL)   Eosinophils Relative 1  0 - 5 (%)   Eosinophils Absolute 0.1  0.0 - 0.7 (K/uL)   Basophils Relative 0  0 - 1 (%)   Basophils Absolute 0.0  0.0 - 0.1 (K/uL)  BASIC METABOLIC PANEL      Component Value Range   Sodium 139  135 - 145 (mEq/L)   Potassium 3.4 (*) 3.5 - 5.1 (mEq/L)   Chloride 103  96 - 112 (mEq/L)   CO2 26  19 - 32 (mEq/L)   Glucose, Bld 93  70 - 99 (mg/dL)   BUN 7  6 - 23 (mg/dL)   Creatinine, Ser 8.41  0.50 - 1.35 (mg/dL)   Calcium 32.4  8.4 - 10.5 (mg/dL)   GFR calc non Af Amer >90  >90 (mL/min)   GFR calc Af Amer >90  >90 (mL/min)    2100:  ACT Team eval completed.  Pt will need admission for psychosis.  No [address] beds available at [facility] at this time.  Info faxed to [facility] for possible placement there.  Pt remains calm/cooperative, holding orders written.     Teale Goodgame Allison Quarry, DO 08/08/11 2223

## 2011-08-09 MED ORDER — ALBUTEROL SULFATE (5 MG/ML) 0.5% IN NEBU
2.5000 mg | INHALATION_SOLUTION | Freq: Once | RESPIRATORY_TRACT | Status: AC
  Administered 2011-08-09: 2.5 mg via RESPIRATORY_TRACT
  Filled 2011-08-09: qty 0.5

## 2011-08-09 NOTE — ED Notes (Signed)
Per [facility] unable to transport patient to [facility].

## 2011-08-09 NOTE — ED Notes (Signed)
Meal given

## 2011-08-09 NOTE — ED Notes (Signed)
Phoned [facility] and talked to larry hill,rn that patient would not be transported this am but sometimes in am due to [facility] being unavailable to transport him. md and charge nurse  Steward Drone notified

## 2011-08-09 NOTE — ED Notes (Signed)
[facility] notified that [facility] was on the way to transport the patient. Report given to [facility].

## 2012-06-16 ENCOUNTER — Encounter (HOSPITAL_COMMUNITY): Payer: Self-pay | Admitting: *Deleted

## 2012-06-16 ENCOUNTER — Emergency Department (HOSPITAL_COMMUNITY)
Admission: EM | Admit: 2012-06-16 | Discharge: 2012-06-18 | Disposition: A | Discharge status: Other Acute Inpatient Hospital | Payer: Self-pay | Attending: Emergency Medicine | Admitting: Emergency Medicine

## 2012-06-16 ENCOUNTER — Other Ambulatory Visit: Payer: Self-pay

## 2012-06-16 DIAGNOSIS — IMO0002 Reserved for concepts with insufficient information to code with codable children: Secondary | ICD-10-CM | POA: Insufficient documentation

## 2012-06-16 DIAGNOSIS — R062 Wheezing: Secondary | ICD-10-CM | POA: Insufficient documentation

## 2012-06-16 DIAGNOSIS — R000 Tachycardia, unspecified: Secondary | ICD-10-CM | POA: Insufficient documentation

## 2012-06-16 DIAGNOSIS — F411 Generalized anxiety disorder: Secondary | ICD-10-CM | POA: Insufficient documentation

## 2012-06-16 DIAGNOSIS — F22 Delusional disorders: Secondary | ICD-10-CM | POA: Insufficient documentation

## 2012-06-16 LAB — RAPID URINE DRUG SCREEN, HOSP PERFORMED
Amphetamines: NOT DETECTED
Benzodiazepines: NOT DETECTED
Opiates: NOT DETECTED
Tetrahydrocannabinol: POSITIVE — AB

## 2012-06-16 LAB — BASIC METABOLIC PANEL
Creatinine, Ser: 1.26 mg/dL (ref 0.50–1.35)
Glucose, Bld: 103 mg/dL — ABNORMAL HIGH (ref 70–99)
Sodium: 133 mEq/L — ABNORMAL LOW (ref 135–145)

## 2012-06-16 LAB — CBC
HCT: 46.4 % (ref 39.0–52.0)
MCV: 85.6 fL (ref 78.0–100.0)
Platelets: 263 10*3/uL (ref 150–400)
RBC: 5.42 MIL/uL (ref 4.22–5.81)
WBC: 13.1 10*3/uL — ABNORMAL HIGH (ref 4.0–10.5)

## 2012-06-16 MED ORDER — LORAZEPAM 1 MG PO TABS
2.0000 mg | ORAL_TABLET | Freq: Once | ORAL | Status: AC
  Administered 2012-06-16: 2 mg via ORAL
  Filled 2012-06-16: qty 2

## 2012-06-16 MED ORDER — SODIUM CHLORIDE 0.9 % IV SOLN
1000.0000 mL | Freq: Once | INTRAVENOUS | Status: AC
  Administered 2012-06-16: 1000 mL via INTRAVENOUS

## 2012-06-16 MED ORDER — ZIPRASIDONE MESYLATE 20 MG IM SOLR
10.0000 mg | Freq: Once | INTRAMUSCULAR | Status: AC
  Administered 2012-06-16: 10 mg via INTRAMUSCULAR
  Filled 2012-06-16: qty 20

## 2012-06-16 MED ORDER — OLANZAPINE 5 MG PO TABS
10.0000 mg | ORAL_TABLET | Freq: Two times a day (BID) | ORAL | Status: DC
  Administered 2012-06-16 – 2012-06-17 (×4): 10 mg via ORAL
  Filled 2012-06-16 (×5): qty 2

## 2012-06-16 MED ORDER — LORAZEPAM 1 MG PO TABS
1.0000 mg | ORAL_TABLET | Freq: Two times a day (BID) | ORAL | Status: DC
  Administered 2012-06-16 – 2012-06-17 (×2): 1 mg via ORAL
  Filled 2012-06-16 (×2): qty 1

## 2012-06-16 MED ORDER — SODIUM CHLORIDE 0.9 % IV SOLN
1000.0000 mL | INTRAVENOUS | Status: DC
  Administered 2012-06-16: 1000 mL via INTRAVENOUS

## 2012-06-16 MED ORDER — ALBUTEROL SULFATE HFA 108 (90 BASE) MCG/ACT IN AERS: 2.0000 | INHALATION_SPRAY | RESPIRATORY_TRACT | Status: DC

## 2012-06-16 MED ORDER — ALBUTEROL SULFATE (5 MG/ML) 0.5% IN NEBU
2.5000 mg | INHALATION_SOLUTION | Freq: Once | RESPIRATORY_TRACT | Status: AC
  Administered 2012-06-16: 2.5 mg via RESPIRATORY_TRACT
  Filled 2012-06-16: qty 0.5

## 2012-06-16 MED ORDER — SODIUM CHLORIDE 0.9 % IN NEBU
INHALATION_SOLUTION | RESPIRATORY_TRACT | Status: AC
  Administered 2012-06-16: 3 mL
  Filled 2012-06-16: qty 3
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
## 2012-06-16 NOTE — ED Provider Notes (Addendum)
History     CSN: [id number]  Arrival date & time 06/16/12  0806   First MD Initiated Contact with Patient 06/16/12 0815      Chief Complaint  Patient presents with  . V70.1    (Consider location/radiation/quality/duration/timing/severity/associated sxs/prior treatment) HPI Comments: Pt has been staying with his grandparents.  He reportedly has been talking about the end of the world coming and when they called police he ran into the woods.  The police chased him through the woods for about 2 hrs.  When they caught him he was holding a kitchen knife to his throat.  He was tasered and disarmed then brought to the ED.  he states he was admitted to [facility] in [place] last November and kept in-patient for ~ 1 week and released on zyprexa and zoloft.  Has been dx with schizophrenia but "i don't think i am".  Has not taken his meds > 4 months because "i can't afford it".  His only physical complaint is soreness to R thigh where a taser dart was stuck in the skin.  He also states his asthma is acting up.  The history is provided by the patient (pt is in police custody). No language interpreter was used.    Past Medical History  Diagnosis Date  . Asthma   . Hallucination, visual   . Schizophrenic disorder     History reviewed. No pertinent past surgical history.  No family history on file.  History  Substance Use Topics  . Smoking status: Current Everyday Smoker -- 1.0 packs/day    Types: Cigarettes  . Smokeless tobacco: Not on file  . Alcohol Use: Yes      Review of Systems  Constitutional: Negative for fever and chills.  Psychiatric/Behavioral: Positive for suicidal ideas, behavioral problems and agitation. The patient is nervous/anxious.     Allergies  Review of patient's allergies indicates no known allergies.  Home Medications   Current Outpatient Rx  Name Route Sig Dispense Refill  . ALBUTEROL SULFATE HFA 108 (90 BASE) MCG/ACT IN AERS Inhalation Inhale 2 puffs into the  lungs every 6 (six) hours as needed. Wheezing/Shortness of Breath     . NAPROXEN SODIUM 220 MG PO TABS Oral Take 440 mg by mouth 2 (two) times daily as needed. Pain    . PALIPERIDONE ER 6 MG PO TB24 Oral Take 1 tablet (6 mg total) by mouth every morning. 10 tablet 0    BP 129/73  Pulse 115  Temp 98.3 F (36.8 C)  Resp 14  SpO2 98%  Physical Exam  Nursing note and vitals reviewed. Constitutional: He is oriented to person, place, and time. He appears well-developed and well-nourished.  Non-toxic appearance. He does not have a sickly appearance. He does not appear ill. No distress.  HENT:  Head: Normocephalic and atraumatic.  Eyes: EOM are normal.  Neck: Normal range of motion.  Cardiovascular: Regular rhythm, normal heart sounds and intact distal pulses.  Tachycardia present.   Pulmonary/Chest: Effort normal. No accessory muscle usage. Not tachypneic. No respiratory distress. He has no decreased breath sounds. He has wheezes. He has no rhonchi. He has no rales.  Abdominal: Soft. He exhibits no distension. There is no tenderness.  Musculoskeletal: Normal range of motion.  Neurological: He is alert and oriented to person, place, and time.  Skin: Skin is warm and dry.  Psychiatric: He has a normal mood and affect. Judgment normal.    ED Course  Procedures (including critical care time)  Labs  Reviewed  BASIC METABOLIC PANEL - Abnormal; Notable for the following:    Sodium 133 (*)     Glucose, Bld 103 (*)     GFR calc non Af Amer 78 (*)     GFR calc Af Amer 90 (*)     All other components within normal limits  URINE RAPID DRUG SCREEN (HOSP PERFORMED) - Abnormal; Notable for the following:    Tetrahydrocannabinol POSITIVE (*)     All other components within normal limits  CBC - Abnormal; Notable for the following:    WBC 13.1 (*)     All other components within normal limits  ETHANOL   No results found.   1. Delusional thoughts      Date: 06/16/2012  Rate: 142  Rhythm:  sinus tachycardia  QRS Axis: normal  Intervals: normal  ST/T Wave abnormalities: normal  Conduction Disutrbances:none  Narrative Interpretation:   Old EKG Reviewed: none available    MDM          Evalina Field, PA 06/16/12 0926  Evalina Field, PA 06/16/12 1704

## 2012-06-16 NOTE — ED Notes (Signed)
Pt admits to drinking about 7 beers last night.

## 2012-06-16 NOTE — ED Notes (Signed)
Pt is currently asleep and [employer] at bedside.

## 2012-06-16 NOTE — ED Provider Notes (Signed)
Medical screening examination/treatment/procedure(s) were conducted as a shared visit with non-physician practitioner(s) and myself.  I personally evaluated the patient during the encounter  The patient be hydrated in the emergency department.  He is medically cleared at this time.  He has what appears to be delusional thoughts and history of schizophrenia.  He's been noncompliant with his medications.  Behavioral health team has been involved work on placement for this patient.  I last the psychiatrist to evaluate the patient for disposition and medication recommendations.  The patient is under involuntary commitment.  Is tachycardia secondary to mild dehydration and possibly drug abuse but is not admitting to.   1. Delusional thoughts      Lyanne Co, MD 06/16/12 [phone]
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
## 2012-06-16 NOTE — ED Notes (Signed)
Tele psych being done at this time. 

## 2012-06-16 NOTE — ED Notes (Signed)
Patient placed in blue scrubs, personal belongings placed in bag (denies having any valuables) with sticker and locked in EMS bay cabinet. Patient wanded by security. [place] Emergency planning/management officer to stay with patient. EDPA in room to assess patient.

## 2012-06-17 MED ORDER — ALBUTEROL SULFATE (5 MG/ML) 0.5% IN NEBU
2.5000 mg | INHALATION_SOLUTION | Freq: Once | RESPIRATORY_TRACT | Status: AC
  Administered 2012-06-17: 2.5 mg via RESPIRATORY_TRACT
  Filled 2012-06-17: qty 0.5

## 2012-06-17 MED ORDER — LORAZEPAM 1 MG PO TABS
ORAL_TABLET | ORAL | Status: AC
  Administered 2012-06-17: 1 mg
  Filled 2012-06-17: qty 1

## 2012-06-17 NOTE — ED Notes (Addendum)
Report called to staff at Adult unit at [facility]
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
## 2012-06-17 NOTE — ED Provider Notes (Signed)
[id number] Patient with delusional thoughts, IVC paperwork on chart. Awaiting bed at [facility].  Nicoletta Dress. Colon Branch, MD 06/17/12 [phone]
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
## 2012-06-17 NOTE — BHH Counselor (Signed)
Louis Scott, ACT counselor at [facility], submitted Pt for admission to [facility]. Thurman Coyer, [facility] confirmed bed availability. Donell Sievert, PA reviewed clinical information and accepted Pt to the service of Dr. Harvie Heck Readling, room 403-2.  Harlin Rain Patsy [place], LPC

## 2012-06-17 NOTE — ED Notes (Signed)
Pt alert, oriented and cooperative. No distress noted at this time. Denies needs.

## 2012-06-17 NOTE — ED Notes (Signed)
Pt requested nebulizer. Ins and exp wheezing noted bilaterally. Dr Colon Branch notified and resp therapy in room at this time.

## 2012-06-17 NOTE — ED Notes (Signed)
[employer] contacted to provide transportation for pt to [facility].

## 2012-06-17 NOTE — ED Notes (Addendum)
Rosey Bath from [facility] called to notify that pt has been excepted by Dr. Otho Perl but will be placed on waiting list. [facility] at [facility] time.

## 2012-06-18 ENCOUNTER — Inpatient Hospital Stay (HOSPITAL_COMMUNITY)
Admission: AD | Admit: 2012-06-18 | Discharge: 2012-06-20 | DRG: 885 | Disposition: A | Discharge status: Home / Self Care | Payer: 59 | Source: Ambulatory Visit | Attending: Psychiatry | Admitting: Psychiatry

## 2012-06-18 ENCOUNTER — Encounter (HOSPITAL_COMMUNITY): Payer: Self-pay | Admitting: *Deleted

## 2012-06-18 DIAGNOSIS — F17200 Nicotine dependence, unspecified, uncomplicated: Secondary | ICD-10-CM | POA: Diagnosis present

## 2012-06-18 DIAGNOSIS — J45909 Unspecified asthma, uncomplicated: Secondary | ICD-10-CM | POA: Diagnosis present

## 2012-06-18 DIAGNOSIS — IMO0002 Reserved for concepts with insufficient information to code with codable children: Secondary | ICD-10-CM

## 2012-06-18 DIAGNOSIS — F1210 Cannabis abuse, uncomplicated: Secondary | ICD-10-CM | POA: Diagnosis present

## 2012-06-18 DIAGNOSIS — Z79899 Other long term (current) drug therapy: Secondary | ICD-10-CM

## 2012-06-18 DIAGNOSIS — F1110 Opioid abuse, uncomplicated: Secondary | ICD-10-CM | POA: Diagnosis present

## 2012-06-18 DIAGNOSIS — F1010 Alcohol abuse, uncomplicated: Secondary | ICD-10-CM | POA: Diagnosis present

## 2012-06-18 DIAGNOSIS — F209 Schizophrenia, unspecified: Secondary | ICD-10-CM

## 2012-06-18 DIAGNOSIS — F203 Undifferentiated schizophrenia: Secondary | ICD-10-CM | POA: Diagnosis present

## 2012-06-18 DIAGNOSIS — F2089 Other schizophrenia: Principal | ICD-10-CM | POA: Diagnosis present

## 2012-06-18 LAB — HEPATIC FUNCTION PANEL
AST: 18 U/L (ref 0–37)
Albumin: 3.6 g/dL (ref 3.5–5.2)
Total Bilirubin: 0.3 mg/dL (ref 0.3–1.2)
Total Protein: 6.1 g/dL (ref 6.0–8.3)

## 2012-06-18 MED ORDER — NAPROXEN SODIUM 550 MG PO TABS
550.0000 mg | ORAL_TABLET | Freq: Two times a day (BID) | ORAL | Status: DC | PRN
  Filled 2012-06-18: qty 1

## 2012-06-18 MED ORDER — ENSURE COMPLETE PO LIQD
237.0000 mL | Freq: Every day | ORAL | Status: DC
  Administered 2012-06-18 – 2012-06-19 (×2): 237 mL via ORAL

## 2012-06-18 MED ORDER — ALBUTEROL SULFATE HFA 108 (90 BASE) MCG/ACT IN AERS: 2.0000 | INHALATION_SPRAY | RESPIRATORY_TRACT | Status: DC | PRN

## 2012-06-18 MED ORDER — SERTRALINE HCL 50 MG PO TABS
50.0000 mg | ORAL_TABLET | Freq: Every day | ORAL | Status: DC
  Administered 2012-06-18 – 2012-06-20 (×3): 50 mg via ORAL
  Filled 2012-06-18 (×4): qty 1
  Filled 2012-06-18: qty 14
  Filled 2012-06-18: qty 1

## 2012-06-18 MED ORDER — ACETAMINOPHEN 325 MG PO TABS: 650.0000 mg | ORAL_TABLET | Freq: Four times a day (QID) | ORAL | Status: DC | PRN

## 2012-06-18 MED ORDER — MAGNESIUM HYDROXIDE 400 MG/5ML PO SUSP: 30.0000 mL | Freq: Every day | ORAL | Status: DC | PRN

## 2012-06-18 MED ORDER — NAPROXEN 500 MG PO TABS: 550.0000 mg | ORAL_TABLET | Freq: Two times a day (BID) | ORAL | Status: DC | PRN

## 2012-06-18 MED ORDER — NICOTINE 21 MG/24HR TD PT24
21.0000 mg | MEDICATED_PATCH | Freq: Every day | TRANSDERMAL | Status: DC
  Administered 2012-06-18: 21 mg via TRANSDERMAL
  Filled 2012-06-18 (×5): qty 1

## 2012-06-18 MED ORDER — TRAZODONE HCL 50 MG PO TABS
50.0000 mg | ORAL_TABLET | Freq: Every evening | ORAL | Status: DC | PRN
  Administered 2012-06-18 – 2012-06-19 (×3): 50 mg via ORAL
  Filled 2012-06-18: qty 1
  Filled 2012-06-18: qty 14
  Filled 2012-06-18: qty 1
  Filled 2012-06-18: qty 14
  Filled 2012-06-18 (×6): qty 1

## 2012-06-18 MED ORDER — ALUM & MAG HYDROXIDE-SIMETH 200-200-20 MG/5ML PO SUSP: 30.0000 mL | ORAL | Status: DC | PRN

## 2012-06-18 MED ORDER — GABAPENTIN 300 MG PO CAPS
300.0000 mg | ORAL_CAPSULE | ORAL | Status: DC
  Administered 2012-06-18 – 2012-06-20 (×7): 300 mg via ORAL
  Filled 2012-06-18 (×4): qty 1
  Filled 2012-06-18: qty 42
  Filled 2012-06-18 (×2): qty 1
  Filled 2012-06-18: qty 42
  Filled 2012-06-18: qty 1
  Filled 2012-06-18: qty 42
  Filled 2012-06-18 (×3): qty 1

## 2012-06-18 MED ORDER — OLANZAPINE 5 MG PO TABS
5.0000 mg | ORAL_TABLET | Freq: Every day | ORAL | Status: DC
  Administered 2012-06-18 – 2012-06-19 (×2): 5 mg via ORAL
  Filled 2012-06-18 (×3): qty 1
  Filled 2012-06-18: qty 14
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
## 2012-06-18 NOTE — ED Provider Notes (Signed)
Medical screening examination/treatment/procedure(s) were conducted as a shared visit with non-physician practitioner(s) and myself.  I personally evaluated the patient during the encounter  Please see my other note  Lyanne Co, MD 06/18/12 1427
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
## 2012-06-18 NOTE — Progress Notes (Addendum)
Patient signed voluntary paperwork. Copy given to case management secretary and original placed in chart. Joice Lofts RN MS EdS 06/18/2012  3:46 PM

## 2012-06-18 NOTE — Progress Notes (Signed)
Psychoeducational Group Note  Date:  06/18/2012 Time:  0930  Group Topic/Focus:  Recovery Goals:   The focus of this group is to identify appropriate goals for recovery and establish a plan to achieve them.  Participation Level: Did Not Attend  Participation Quality:  Not Applicable  Affect:  Not Applicable  Cognitive:  Not Applicable  Insight:  Not Applicable  Engagement in Group: Not Applicable  Additional Comments:  Pt refused to attend group this morning.  Emanii Bugbee E 06/18/2012, 11:16 AM

## 2012-06-18 NOTE — Progress Notes (Signed)
D: In dayroom, watching TV with peers on approach. Appears flat and depressed. Calm and cooperative with assessment. States he feels like he has had a good day. States he enjoyed groups (although it was reported he didn't attend any on day shift). Did attend wrap up group and his participation was appropriate. Denies SI/HI/AVH and contracts for safety. Did have some questions about HS meds. Otherwise offered no questions or concerns.  A: Safety has been maintained with Q15 minute observation. Support and encouragement provided. POC and medications for the shift reviewed and understanding verbalized. Meds given per MD order. Discussed HS meds and explained the indications and expected outcomes as well as possible side effects. Verbalized understanding.   R: Pt remains safe. He is flat and depressed. He is attending groups and is participating appropriately. He offers no additional questions or concerns. Will continue Q15 min observation and continue current POC.

## 2012-06-18 NOTE — Progress Notes (Signed)
Nutrition Brief Note  Patient identified on the Malnutrition Screening Tool (MST) report for unintended weight loss and decreased appetite, generating a score of 3.   Body mass index is 24.11 kg/(m^2). Pt meets criteria for healthy normal weight based on current BMI.   - Met with pt who reports eating 1 meal/day since he was 26 years old and stable weight of between 140-165 pounds. Pt reports his daily meal consists of either a pizza or going out to eat and selecting a lot of vegetables. Pt reports he has been eating well here. Pt interested in getting Ensure at night, will order. Discussed sample balanced diet with small frequent meals and snacks. Pt without any further nutrition educational needs.   No nutrition interventions warranted at this time. If nutrition issues arise, please consult RD.   Levon Hedger MS, RD, LDN [phone] Pager [phone] After Hours Pager
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
## 2012-06-18 NOTE — Progress Notes (Signed)
(  D) Patient isolative to room, not attending groups. Reports that he is hearing some voices but unable to define them. Brief eye contact. (A) Started on home medications. Encouraged to attend all groups today but has not. (R) In room resting. Joice Lofts RN MS EdS 06/18/2012  3:34 PM

## 2012-06-18 NOTE — Progress Notes (Signed)
[facility] Group Notes:  (Counselor/Nursing/MHT/Case Management/Adjunct)  06/18/2012 1:22 PM  Type of Therapy:  Group Therapy  Participation Level:  Did Not Attend  Murice Barbar, LPCA    Anakaren Campion L 06/18/2012, 1:22 PM

## 2012-06-18 NOTE — Tx Team (Signed)
Initial Interdisciplinary Treatment Plan  PATIENT STRENGTHS: (choose at least two) Ability for insight Active sense of humor Average or above average intelligence Capable of independent living Communication skills General fund of knowledge Motivation for treatment/growth Physical Health Religious Affiliation Special hobby/interest Supportive family/friends  PATIENT STRESSORS: Financial difficulties Marital or family conflict Medication change or noncompliance Substance abuse   PROBLEM LIST: Problem List/Patient Goals Date to be addressed Date deferred Reason deferred Estimated date of resolution   "I wanna make sure I am right or I'm crazy" referring to the world ending 06/18/12           "Help me get off drugs, I guess" 06/18/12     Substance abuse 06/18/12     Delusional 06/18/12     Increase risk for suicide 06/18/12                        DISCHARGE CRITERIA:  Ability to meet basic life and health needs Adequate post-discharge living arrangements Improved stabilization in mood, thinking, and/or behavior Medical problems require only outpatient monitoring Motivation to continue treatment in a less acute level of care Need for constant or close observation no longer present Reduction of life-threatening or endangering symptoms to within safe limits Safe-care adequate arrangements made Verbal commitment to aftercare and medication compliance  PRELIMINARY DISCHARGE PLAN: Outpatient therapy Participate in family therapy Return to previous living arrangement  PATIENT/FAMIILY INVOLVEMENT: This treatment plan has been presented to and reviewed with the patient, Louis Scott, and/or family member.  The patient and family have been given the opportunity to ask questions and make suggestions.  Louis Scott 06/18/2012, 2:28 AM

## 2012-06-18 NOTE — Tx Team (Signed)
Interdisciplinary Treatment Plan Update (Adult)  Date: 06/18/2012  Time Reviewed: 1000  Progress in Treatment: Attending groups: No Participating in groups:  No Taking medication as prescribed: Yes Tolerating medication:  Started today. Family/Significant othe contact made: Facilities manager   Patient understands diagnosis:  Yes Discussing patient identified problems/goals with staff:  Yes Medical problems stabilized or resolved:  Yes Denies suicidal/homicidal ideation: Yes Issues/concerns per patient self-inventory:  Did not complete. Other: N/A  New problem(s) identified: Reports visual hallucination and auditory hallucinations.  Reason for Continuation of Hospitalization: Depression Hallucinations Medication stabilization   Interventions implemented related to continuation of hospitalization: mood stabilization, medication monitoring and adjustment, group therapy and psycho education, safety checks q 15 mins  Additional comments: N/A  Estimated length of stay: 3-5 days  Discharge Plan: SW is assessing for appropriate referrals.   New goal(s): N/A  Review of initial/current patient goals per problem list:   1.  Goal(s): Reduce depressive symptoms  Met:  No  Target date: by discharge  As evidenced by: Reducing depression from a 10 to a 3 as reported by pt.     2.  Goal(s): Reduce Psychosis  Met:  No  Target date: by discharge  As evidenced by: Reduce psychotic symptoms to baseline, as reported by pt.      Attendees: Patient:  Louis Scott 06/18/2012 1000  Family:     Physician:  Franchot Gallo, MD 06/18/2012 1000  Nursing:   Barrie Folk RN 06/18/2012 1000  Case Manager:   06/18/2012 1000  Counselor:  Veto Kemps, MT-BC 06/18/2012 1000  Other:     Other:  Joslyn Devon RN 06/18/2012 1000  Other:     Other:      Scribe for Treatment Team:   Barrie Folk 06/18/2012 1000

## 2012-06-19 DIAGNOSIS — F1210 Cannabis abuse, uncomplicated: Secondary | ICD-10-CM

## 2012-06-19 NOTE — Discharge Planning (Signed)
06/19/2012  SW met with Louis Scott in discharge planning group.  SW found Louis Scott to have adequate transportation upon discharge.  SW found Louis Scott to need contacts made on their behalf.  SW found Louis Scott has current service providers, and they are [facility] in [place].  Pt states he has been off meds and not going to [facility] for at least four months.  SW encouraged Pt to make change to begin meeting for medication management monthly with his provider to assist with preventing psychotic breaks and from becoming highly suicidal as patient states this is a pattern for him.  SW found Louis Scott to rate his depression at 2/10 and anxiety at 2/10 today.  SW will continue to assess for referrals.  Clarice Pole, LCASA 06/19/2012, 6:03 PM

## 2012-06-19 NOTE — Progress Notes (Signed)
[facility] Group Notes:  (Counselor/Nursing/MHT/Case Management/Adjunct)  06/19/2012 12:33 PM  Type of Therapy:  Discharge Planning  06/19/12  Participation Level:  None  Participation Quality:  Patient was in group just long enough to introduce himself, and then left to talk to staff.     HartisAram Scott 06/19/2012, 12:33 PM

## 2012-06-19 NOTE — Progress Notes (Signed)
The focus of this group is to help patients review their daily goal of treatment and discuss progress on daily workbooks. Pt attended and participated in the group discussion about recovery goals. Pt stated that it was his goal not to be cured of his illness, but to be able to manage it and still live his life as he chooses to. Pt stated he felt this was a more realistic goal than to be entirely cured.

## 2012-06-19 NOTE — Progress Notes (Signed)
Psychoeducational Group Note  Date:  06/19/2012 Time:  2000  Group Topic/Focus:  Wrap-Up Group:   The focus of this group is to help patients review their daily goal of treatment and discuss progress on daily workbooks.  Participation Level: Did Not Attend  Participation Quality:  Not Applicable  Affect:  Not Applicable  Cognitive:  Not Applicable  Insight:  Not Applicable  Engagement in Group: Not Applicable  Additional Comments:    Flonnie Hailstone 06/19/2012, 9:29 PM

## 2012-06-19 NOTE — Progress Notes (Signed)
[facility] MD Progress Note                                         06/19/2012    Louis Scott Jan 09, 1986    [id number] Hospital day #1  1. Schizophrenia, undifferentiated   2. Cannabis abuse   The patient was seen today and reports the following:  Sleep: good Appetite: increasing  Mild (1-10) Severe  Depression (1-10):  2 Anxiety (1-10):  2 Hopelessness (1-10): 2  Suicidal Ideation: The patient denies suicidal ideation. Plan: None Intent: None Means: None  Homicidal Ideation: The patient denies homicidal ideation. Plan: None Intent: None Means: None  Eye Contact:  good General Appearance: disheveled Behavior:  Cooperative  Motor Behavior: normal Speech: clear and goal directed  Mental Status:  Orientation x 3 Level of Consciousness:   alert Mood: euthymic Affect: congruent   Thought Process:  normal Thought Content:  normal Perception: intact  Judgment: fair Insight: present Cognition:  At least average  VS: height is 5' 5.25" (1.657 m) and weight is 66.225 kg (146 lb). His oral temperature is 97 F (36.1 C). His blood pressure is 137/80 and his pulse is 85. His respiration is 18.   Current Medication:  . feeding supplement  237 mL Oral QHS  . gabapentin  300 mg Oral [id number]  . nicotine  21 mg Transdermal Q0600  . OLANZapine  5 mg Oral QHS  . sertraline  50 mg Oral Daily  . traZODone  50 mg Oral QHS,MR X 1    Lab results:  Results for orders placed during the hospital encounter of 06/18/12 (from the past 48 hour(s))  TSH     Status: Normal   Collection Time   06/18/12  6:10 AM      Component Value Range Comment   TSH 1.203  0.350 - 4.500 uIU/mL   HEPATIC FUNCTION PANEL     Status: Normal   Collection Time   06/18/12  6:10 AM      Component Value Range Comment   Total Protein 6.1  6.0 - 8.3 g/dL    Albumin 3.6  3.5 - 5.2 g/dL    AST 18  0 - 37 U/L    ALT 9  0 - 53 U/L    Alkaline Phosphatase 57  39 - 117 U/L    Total Bilirubin 0.3  0.3 - 1.2  mg/dL    Bilirubin, Direct <7.8  0.0 - 0.3 mg/dL    Indirect Bilirubin NOT CALCULATED  0.3 - 0.9 mg/dL     No results found for this or any previous visit for  Last 48 hours.  Group attendance: 2 yesterday, 2 the day before ROS:    Constitutional: WDWN Adult in NAD   GI: Negative for N,V,D,C   Neuro: Negative for dizziness, blurred vision, visual changes, headaches   Resp: Negative for wheezing, SOB, cough   Cardio: Negative for CP, diaphoresis, fatigue   MSK: Negative for joint pain, swelling, DROM, or ambulatory difficulties.  Time was spent with the patient discussing the current symptoms and the response to treatment.  He states that he is much better today and that his hallucinations are resolving significantly. Louis Scott states that he is 70-80% better.  Treatment Summary: 1. Admit for crisis management and stabilization. 2. Medication management to reduce current symptoms to base line and improve the     patient's  overall level of functioning 3. Treat health problems as indicated. 4. Develop treatment plan to decrease risk of relapse upon discharge and the need for readmission. 5. Psycho-social education regarding relapse prevention and self care. 6. Health care follow up as needed for medical problems. 7. Restart home medications where appropriate.   Plan:  1. Will continue the patient on the medication Zoloft at 50 mgs po qam for depression and anxiety.  2. Will continue the patient on the medication Zyprexa at 5 mgs po qhs for psychosis.  3. Will continue the patient on the medication Neurontin at 300 mgs po q am, 2 pm and hs for anxiety.  4. Will continue the medication Trazodone at 50 mgs po qhs with a repeat x 1 for sleep.  5. Laboratory Studies reviewed.  6. Will continue to monitor.  Rona Ravens. Louis Scott [facility] 06/19/2012

## 2012-06-19 NOTE — Progress Notes (Signed)
06/19/2012         Time: 0930      Group Topic/Focus: The focus of the group is on enhancing the patients' ability to utilize positive relaxation strategies by practicing several that can be used at discharge.  Participation Level: Did not attend  Participation Quality: Not Applicable  Affect: Not Applicable  Cognitive: Not Applicable   Additional Comments: Patient present for the last few minutes of group, was reportedly in the shower.    Daisie Haft 06/19/2012 1:29 PM

## 2012-06-19 NOTE — Progress Notes (Signed)
D: In dayroom, watching TV with peers on approach. Appears bright. Is open and spontaneous in conversation. Calm and cooperative with assessment. States he feels like he has had a good day. States he had a good day. Pointed out going outside and a group with "a person whose story sounds just like mine.". States he could relate to what the visitor was saying and it made him a little more hopeful. He is eager for D/C and feels like his mood and thoughts are stable. Denies SI/HI/AVH and contracts for safety. Offered no questions or concerns.   A: Safety has been maintained with Q15 minute observation. Support and encouragement provided. POC and medications for the shift reviewed and understanding verbalized. Meds given per MD order.   R: Pt remains safe. His mood seems much improved vs previous night. He is attending groups and is participating appropriately. He feels he is ready for D/C. He offers no additional questions or concerns. Will continue Q15 min observation and continue current POC

## 2012-06-19 NOTE — Progress Notes (Signed)
D:  Pt about on the unit today.  He presents with a flat affect.  Does smile on occasion.  He stated he slept well last night.  He denies SI/HI and A/V hallucinataions. He rates his depression as a 2/10 and hopelessness 2/10.  He is attending groups.  He states that he plans to change things in his life after discharge by looking for the good things in life, love my family, stay on my medicine.  A:  Offered support and encouragement.   Given medications as prescribed.  R:  Pt receptive to staff.  Remains on q 15 minute checks.  Will continue to monitor.

## 2012-06-19 NOTE — Progress Notes (Signed)
Psychoeducational Group Note  Date:  06/19/2012 Time:  1100  Group Topic/Focus:  Personal Choices and Values:   The focus of this group is to help patients assess and explore the importance of values in their lives, how their values affect their decisions, how they express their values and what opposes their expression.  Participation Level:  Did Not Attend  Participation Quality:    Affect:    Cognitive:    Insight:    Engagement in Group:    Additional Comments: pt was in the shower, did not attend.  Isla Pence M 06/19/2012, 1:35 PM

## 2012-06-20 MED ORDER — OLANZAPINE 5 MG PO TABS: 5.0000 mg | ORAL_TABLET | Freq: Every day | ORAL | Status: DC

## 2012-06-20 MED ORDER — TRAZODONE HCL 50 MG PO TABS: 50.0000 mg | ORAL_TABLET | Freq: Every evening | ORAL | Status: DC | PRN

## 2012-06-20 MED ORDER — GABAPENTIN 300 MG PO CAPS: 300.0000 mg | ORAL_CAPSULE | ORAL | Status: DC

## 2012-06-20 MED ORDER — ALBUTEROL SULFATE HFA 108 (90 BASE) MCG/ACT IN AERS: 2.0000 | INHALATION_SPRAY | RESPIRATORY_TRACT | Status: DC | PRN

## 2012-06-20 MED ORDER — SERTRALINE HCL 50 MG PO TABS: 50.0000 mg | ORAL_TABLET | Freq: Every day | ORAL | Status: DC

## 2012-06-20 NOTE — Progress Notes (Addendum)
[facility] Adult Inpatient Family/Significant Other Suicide Prevention Education  Suicide Prevention Education:  Education Completed; Nathanial Rancher (mother) [phone]) has been identified by the patient as the family member/significant other with whom the patient will be residing, and identified as the person(s) who will aid the patient in the event of a mental health crisis (suicidal ideations/suicide attempt).  With written consent from the patient, the family member/significant other has been provided the following suicide prevention education, prior to the and/or following the discharge of the patient.  The suicide prevention education provided includes the following:  Suicide risk factors  Suicide prevention and interventions  National Suicide Hotline telephone number  [facility] assessment telephone number  [facility] Emergency Assistance 911  [facility] and/or Residential Mobile Crisis Unit telephone number  Request made of family/significant other to:  Remove weapons (e.g., guns, rifles, knives), all items previously/currently identified as safety concern.    Remove drugs/medications (over-the-counter, prescriptions, illicit drugs), all items previously/currently identified as a safety concern.  The family member/significant other verbalizes understanding of the suicide prevention education information provided.  The family member/significant other agrees to remove the items of safety concern listed above.  Mother reported his grandmother having a shot gun above her door, but more for decoration. The bullets are not with it. Grandfather also has a gun, but this is locked up where patient doesn't know where it is. Encouraged mother to educate grandparents, family and girlfriend about keeping the environment safe, since patient's behavior is often unpredictable.  Louis Scott, Louis Scott 06/20/2012, 9:03 AM

## 2012-06-20 NOTE — Tx Team (Signed)
Interdisciplinary Treatment Plan Update (Adult) Date: 06/20/2012  Time Reviewed: 10:53 AM  Progress in Treatment: Attending groups: Yes Participating in groups: Yes Taking medication as prescribed: Yes Tolerating medication: Yes Family/Significant othe contact made: Counselor spoke to his mother. Patient understands diagnosis: Yes Discussing patient identified problems/goals with staff: Yes Medical problems stabilized or resolved: Yes Denies suicidal/homicidal ideation: Yes Issues/concerns per patient self-inventory: None identified Other: N/A New problem(s) identified: None Identified Reason for Continuation of Hospitalization: Other; describe  patient is discharging today. Interventions implemented related to continuation of hospitalization: patient discharging today. Additional comments: N/A Estimated length of stay: discharge today. Discharge Plan: Pt discharging today.  New goal(s): N/A Review of initial/current patient goals per problem list:  1. Goal(s): Reduce depressive symptoms  Met: Yes  Target date: by discharge  As evidenced by: Reducing depression from a 10 to a 3 as reported by pt.  2. Goal (s): Eliminate Suicidal Ideation  Met: Yes  Target date: by discharge  As evidenced by: Eliminate suicidal ideation.  3. Goal(s): Reduce Psychosis  Met: Yes  Target date: by discharge  As evidenced by: Reduce psychotic symptoms to baseline, as reported by pt.  4. Goal(s):  Address follow-up and aftercare plan  Met: Yes  Target date: by discharge  As evidenced by:  Patient to follow-up with [facility].  Patient to consult with [facility] about Patient Assistance Program to further discuss issues with medication costs. Attendees: Patient: Louis Scott  06/20/2012 10:54 AM  Family:    Physician: Franchot Gallo, MD 06/20/2012 10:53 AM   Nursing:    Case Manager: Clarice Pole, LCASA 06/20/2012 10:53 AM   Counselor: Veto Kemps, MT-BC 06/20/2012 10:53 AM     Other: Abbie Sons, RN 06/20/2012 10:53 AM   Other:    Other:    Other:    Scribe for Treatment Team:  Clarice Pole, LCASA 06/20/2012 10:53 AM

## 2012-06-20 NOTE — Discharge Planning (Signed)
06/20/2012  SW met with Marrion Coy in discharge planning group.  SW found RIYAAN HEROUX has current service providers, and they are [facility] in [place].  SW contacted the coordinate follow-up and inquire if they could assist with patient being able to cover cost of medications.  Patient states depression is low due to thoughts about his grandmother, and he expressed interest in going home.  SW provided feedback on applying for medicaid.  SW will continue to assess for referrals.  Clarice Pole, LCASA 06/20/2012, 10:42 AM

## 2012-06-20 NOTE — [facility] Suicide Risk Assessment (Signed)
Suicide Risk Assessment  Discharge Assessment     Demographic Factors:  Male, Adolescent or young adult, Caucasian, Low socioeconomic status, Unemployed and Access to firearms  Mental Status Per Nursing Assessment::   On Admission:  NA At Time of Discharge:  Time was spent today discussing with the patient his current symptoms. The patient reports to sleeping very well with his current medications with a resolution of his nightmares and flashbacks. The patient reports a good appetite and denies any significant feelings of sadness, anhedonia or depressed mood.  He adamantly denies any suicidal or homicidal ideations as well as any auditory or visual hallucinations or delusional thinking.  The patient does report some mild anxiety symptoms today but denies any medication related side effects or other concerns.  The patient states that he feels ready for discharge to outpatient follow up and this will be ordered today at his request.   Current Mental Status Per Physician:  Diagnosis:  Axis I: Schizophrenia - Undifferentiated Type.  Cannabis Abuse.   The patient was seen today and reports the following:   ADL's: Intact.  Sleep: The patient reports to sleeping very well with his current medications.  Appetite: The patient reports that his appetite is good.   Mild>(1-10) >Severe  Hopelessness (1-10): 0  Depression (1-10): 0  Anxiety (1-10): 2   Suicidal Ideation: The patient adamantly denies any suicidal ideations.  Plan: No  Intent: No  Means: No   Homicidal Ideation: The patient adamantly denies any homicidal ideations today.  Plan: No  Intent: No.  Means: No   General Appearance/Behavior: The patient remains friendly and cooperative today with this provider.  Eye Contact: Good.  Speech: Appropriate in rate and volume with no pressuring of speech noted today.  Motor Behavior: wnl.  Level of Consciousness: Alert and Oriented x 3.  Mental Status: Alert and Oriented x 3.  Mood:  Euthymic today.  Affect: Appears bright and full.  Anxiety Level: Mild anxiety reported today.  Thought Process: wnl  Thought Content: No auditory or visual hallucinations reported today.  The patient denies any delusional thinking.  Perception: wnl Judgment: Good.  Insight: Good.  Cognition: Oriented to person, place and time.   Loss Factors: No Acute Losses Reported.  Historical Factors: Long History of Mental Illness.  Unemployment.  Corporate treasurer.    Risk Reduction Factors:   Good Family Support.  Good Insight into Illness.  Continued Clinical Symptoms:  Alcohol/Substance Abuse/Dependencies More than one psychiatric diagnosis Previous Psychiatric Diagnoses and Treatments Medical Diagnoses and Treatments/Surgeries  Discharge Diagnoses:   AXIS I:   Schizophrenia - Undifferentiated Type.    Cannabis Abuse.  AXIS II:   Deferred. AXIS III:   1. Asthma. AXIS IV:   Long History of Mental Illness.  Unemployment.  Corporate treasurer.   AXIS V:   GAF at time of admission approximately 30.  GAF at time of discharge approximately 60.  Cognitive Features That Contribute To Risk:  None Noted.    Current Medications:  .  gabapentin  300 mg  Oral  [id number]   .  nicotine  21 mg  Transdermal  Q0600   .  OLANZapine  5 mg  Oral  QHS   .  sertraline  50 mg  Oral  Daily   .  traZODone  50 mg  Oral  QHS,MR X 1    Review of Systems:  Neurological: No headaches, seizures or dizziness reported.  G.I.: The patient denies any constipation or stomach upset today.  Musculoskeletal: The patient denies any musculoskeletal related issues.   Time was spent today discussing with the patient his current symptoms. The patient reports to sleeping very well with his current medications with a resolution of his nightmares and flashbacks. The patient reports a good appetite and denies any significant feelings of sadness, anhedonia or depressed mood.  He adamantly denies any suicidal or  homicidal ideations as well as any auditory or visual hallucinations or delusional thinking.  The patient does report some mild anxiety symptoms today but denies any medication related side effects or other concerns.  The patient states that he feels ready for discharge to outpatient follow up and this will be ordered today at his request.   Treatment Plan Summary:  1. Daily contact with patient to assess and evaluate symptoms and progress in treatment.  2. Medication management  3. The patient will deny suicidal ideations or homicidal ideations for 48 hours prior to discharge and have a depression and anxiety rating of 3 or less. The patient will also deny any auditory or visual hallucinations or delusional thinking.  4. The patient will deny any symptoms of substance withdrawal at time of discharge.   Plan:  1. Will continue the patient on the medication Zoloft at 50 mgs po qam for depression and anxiety.  2. Will continue the patient on the medication Zyprexa at 5 mgs po qhs for psychosis.  3. Will continue the patient on the medication Neurontin at 300 mgs po q am, 2 pm and hs for anxiety.  4. Will continue the medication Trazodone at 50 mgs po qhs with a repeat x 1 for sleep.  5. Laboratory Studies reviewed.  6. Will continue to monitor.  7. The patient will be discharged today to outpatient follow up at his request.  Suicide Risk:  Minimal: No identifiable suicidal ideation.  Patients presenting with no risk factors but with morbid ruminations; may be classified as minimal risk based on the severity of the depressive symptoms  Plan Of Care/Follow-up recommendations:  Activity:  As tolerated. Diet:  Regular Diet. Other:  Please take all medications only as directed and keep all scheduled follow up appointments.  Please return to your local Emergency Room should you have any thoughts of harmning yourself or others.  RANDY READLING 06/20/2012, 12:47 PM

## 2012-06-20 NOTE — Progress Notes (Signed)
Psychoeducational Group Note  Date:  06/20/2012 Time:  0930  Group Topic/Focus:  Rediscovering Joy:   The focus of this group is to explore various ways to relieve stress in a positive manner.  Participation Level:  Active  Participation Quality:  Appropriate  Affect:  Appropriate  Cognitive:  Appropriate  Insight:  Good  Engagement in Group:  Good  Additional Comments:  Pt shared positively with the group this morning.  Deryck Hippler E 06/20/2012, 12:32 PM

## 2012-06-20 NOTE — Progress Notes (Signed)
Patient discharged home per MD order.  He denies SI/HI/AVH.  He will follow up with [facility].  He left ambulatory with his mother.  He received all his belongings, prescriptions and sample medications.

## 2012-06-20 NOTE — Progress Notes (Signed)
Adult Services Patient-Family Contact/Session  Attendees:  Patient's mother, Marcelino Duster [phone])  Goal(s):  Discharge planning, collateral  Safety Concerns:  None   Narrative:  Mother described patient as always worrying about the end of time and that the world is going to end. She stated he has OCD and gets things in his mind. Other bizarre behaviors include not eating out of a jar that's been opened, not drinking out of a glass, not eating her cooking but going to [employer] and eating a stranger's cooking. She stated "he ain't that bad of a person, but he's just strange and he doesn't do anything. She is not worried about safety issues. She stated he just gets mad when he doesn't get his way, and has often threatened to hurt himself. She thinks this is what happened the other night after arguing with girlfriend. She stated that they have been up and down with their relationship, but that he was having outbursts way before her. Stated he acts like a 26 year old. He was first hospitalized at [facility] 10 years ago for 18 days. He has had several admissions this year including [facility]. Discussed difficulty of paying for medications now that he is not on her insurance because of his age. She is basically paying for everything currently.  Barrier(s):  None   Interventions:  Support, discharge planning  Recommendation(s): Out patient follow up   Follow-up Required:  No  Explanation:    Veto Kemps 06/20/2012, 9:03 AM
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
## 2012-06-21 NOTE — Progress Notes (Signed)
Patient Discharge Instructions:  After Visit Summary (AVS):   Faxed to:  06/21/2012 Psychiatric Admission Assessment Note:   Faxed to:  06/21/2012 Suicide Risk Assessment - Discharge Assessment:   Faxed to:  06/21/2012 Faxed/Sent to the Next Level Care provider:  06/21/2012  Faxed to [facility] @ [phone]  Heloise Purpura, Eduard Clos, 06/21/2012, 4:19 PM

## 2012-06-30 NOTE — Discharge Summary (Signed)
Physician Discharge Summary Note  Patient:  Louis Scott is an 26 y.o., male MRN:  [id number] DOB:  December 22, 1985 Patient phone:  [phone] (home)  Patient address:   [address] [place] [address]   Date of Admission:  06/18/2012 Date of Discharge: 06/20/2012  Discharge Diagnoses: Principal Problem:  *Schizophrenia, undifferentiated Active Problems:  Cannabis abuse  Axis Diagnosis:  AXIS I: Schizophrenia - Undifferentiated Type.  Cannabis Abuse.  AXIS II: Deferred.  AXIS III: 1. Asthma.  AXIS IV: Long History of Mental Illness. Unemployment. Corporate treasurer.  AXIS V: GAF at time of admission approximately 30. GAF at time of discharge approximately 60.   Level of Care:  Inpatient Hospitalization.  Reason for Admission: Pt was brought to [facility] ER and subsequently to [facility] involuntarily after patient had an altercation with his parents regarding his relationship with his girlfriend, whom the parents do not approve. After the altercation, he became suicidal and ran into the woods with a knife. The police found him 2 hours later, where he was threatening suicide with a knife held at his throat. After being tasered and disarmed he was taken to the ER for treatment. This is patient's 2nd psychiatric hospitalization.  Pt reports long history of psychiatric issues since childhood. He states that at ages 41, 82, and 40 a "halogram of Jesus appeared and talked to me". He hears voices, as if "talking to God", and ideas, thoughts, and dreams "come to fruition", and he can make things occur by "merely thinking." He states that by Jan 14th 98% of all humans on Earth will be dead due to a comet that he has known was "coming for 12 years." Also, people "like the illuminati" are keeping him and his girlfriend from establishing a strong "partnership" because having a monogamous partner gives an individual strength.  Patients reports only other psychiatric hospitalization was in Oct 2012 at  [facility] [place] in [place] where he was admitted for 8 days due to "thoughts I could control things with my thoughts" and treated with zyprexa, ativan, and neurontin. He was diagnosed with schizophrenia at that time. He stopped taking his medication Jan/Feb 2013 due to financial issues  Hospital Course:   The patient attended treatment team meeting this am and met with treatment team members. The patient's symptoms, treatment plan and response to treatment was discussed. The patient endorsed that their symptoms have improved. The patient also stated that they felt stable for discharge.  They reported that from this hospital stay they had learned many coping skills.  In other to maintain their psychiatric stability, they will continue psychiatric care on an outpatient basis. They will follow-up as outlined below.  In addition they were instructed  to take all your medications as prescribed by their mental healthcare provider and to report any adverse effects and or reactions from your medicines to their outpatient provider promptly.  The patient is also instructed and cautioned to not engage in alcohol and or illegal drug use while on prescription medicines.  In the event of worsening symptoms the patient is instructed to call the crisis hotline, 911 and or go to the nearest ED for appropriate evaluation and treatment of symptoms.   Also while a patient in this hospital, the patient received medication management for his psychiatric symptoms. They were ordered and received as outlined below:    Medication List     As of 06/30/2012  9:45 PM    STOP taking these medications  ALEVE 220 MG tablet   Generic drug: naproxen sodium      paliperidone 6 MG 24 hr tablet   Commonly known as: INVEGA      TAKE these medications      Indication    albuterol 108 (90 BASE) MCG/ACT inhaler   Commonly known as: PROVENTIL HFA;VENTOLIN HFA   Inhale 2 puffs into the lungs every 4 (four) hours as needed for  wheezing or shortness of breath.       gabapentin 300 MG capsule   Commonly known as: NEURONTIN   Take 1 capsule (300 mg total) by mouth 3 (three) times daily at 8am, 2pm and bedtime. For anxiety and mood stabilization.       OLANZapine 5 MG tablet   Commonly known as: ZYPREXA   Take 1 tablet (5 mg total) by mouth at bedtime. For clarity of thought.       sertraline 50 MG tablet   Commonly known as: ZOLOFT   Take 1 tablet (50 mg total) by mouth daily. For depression and anxiety.       traZODone 50 MG tablet   Commonly known as: DESYREL   Take 1 tablet (50 mg total) by mouth at bedtime and may repeat dose one time if needed. For sleep.        They were also enrolled in group counseling sessions and activities in which they participated actively.       Follow-up Information    Follow up with [facility]. Call on 06/24/2012. (Pt scheduled for 7:45am.  Patient referral number [phone].  Ask [facility] about the patient assistance program to ensure you are able to maintain medication with assistance of costs.)    Contact information:   [address] [place], [place] [address] [phone]        Upon discharge, patient adamantly denies suicidal, homicidal ideations, auditory, visual hallucinations and or delusional thinking. They left [facility] with all personal belongings via personal transportation in no apparent distress.  Consults:  Please see electronic medical record for details.  Significant Diagnostic Studies:  Please see electronic medical record for details.  Discharge Vitals:   Blood pressure 112/77, pulse 82, temperature 97 F (36.1 C), temperature source Oral, resp. rate 16, height 5' 5.25" (1.657 m), weight 66.225 kg (146 lb)..  Mental Status Exam: Demographic Factors:  Male, Adolescent or young adult, Caucasian, Low socioeconomic status, Unemployed and Access to firearms  Mental Status Per Nursing Assessment::  On Admission: NA  At Time of Discharge: Time was spent today discussing with  the patient his current symptoms. The patient reports to sleeping very well with his current medications with a resolution of his nightmares and flashbacks. The patient reports a good appetite and denies any significant feelings of sadness, anhedonia or depressed mood. He adamantly denies any suicidal or homicidal ideations as well as any auditory or visual hallucinations or delusional thinking. The patient does report some mild anxiety symptoms today but denies any medication related side effects or other concerns.  The patient states that he feels ready for discharge to outpatient follow up and this will be ordered today at his request.  Current Mental Status Per Physician:  Diagnosis:  Axis I: Schizophrenia - Undifferentiated Type.  Cannabis Abuse.  The patient was seen today and reports the following:  ADL's: Intact.  Sleep: The patient reports to sleeping very well with his current medications.  Appetite: The patient reports that his appetite is good.  Mild>(1-10) >Severe  Hopelessness (1-10): 0  Depression (  1-10): 0  Anxiety (1-10): 2  Suicidal Ideation: The patient adamantly denies any suicidal ideations.  Plan: No  Intent: No  Means: No  Homicidal Ideation: The patient adamantly denies any homicidal ideations today.  Plan: No  Intent: No.  Means: No  General Appearance/Behavior: The patient remains friendly and cooperative today with this provider.  Eye Contact: Good.  Speech: Appropriate in rate and volume with no pressuring of speech noted today.  Motor Behavior: wnl.  Level of Consciousness: Alert and Oriented x 3.  Mental Status: Alert and Oriented x 3.  Mood: Euthymic today.  Affect: Appears bright and full.  Anxiety Level: Mild anxiety reported today.  Thought Process: wnl  Thought Content: No auditory or visual hallucinations reported today. The patient denies any delusional thinking.  Perception: wnl  Judgment: Good.  Insight: Good.  Cognition: Oriented to person,  place and time.  Loss Factors:  No Acute Losses Reported.  Historical Factors:  Long History of Mental Illness. Unemployment. Corporate treasurer.  Risk Reduction Factors:  Good Family Support. Good Insight into Illness.  Continued Clinical Symptoms:  Alcohol/Substance Abuse/Dependencies  More than one psychiatric diagnosis  Previous Psychiatric Diagnoses and Treatments  Medical Diagnoses and Treatments/Surgeries  Discharge Diagnoses:  AXIS I: Schizophrenia - Undifferentiated Type.  Cannabis Abuse.  AXIS II: Deferred.  AXIS III: 1. Asthma.  AXIS IV: Long History of Mental Illness. Unemployment. Corporate treasurer.  AXIS V: GAF at time of admission approximately 30. GAF at time of discharge approximately 60.  Cognitive Features That Contribute To Risk:  None Noted.  Current Medications:  .  gabapentin  300 mg  Oral  [id number]   .  nicotine  21 mg  Transdermal  [id number]   .  OLANZapine  5 mg  Oral  QHS   .  sertraline  50 mg  Oral  Daily   .  traZODone  50 mg  Oral  QHS,MR X 1   Review of Systems:  Neurological: No headaches, seizures or dizziness reported.  G.I.: The patient denies any constipation or stomach upset today.  Musculoskeletal: The patient denies any musculoskeletal related issues.  Time was spent today discussing with the patient his current symptoms. The patient reports to sleeping very well with his current medications with a resolution of his nightmares and flashbacks. The patient reports a good appetite and denies any significant feelings of sadness, anhedonia or depressed mood. He adamantly denies any suicidal or homicidal ideations as well as any auditory or visual hallucinations or delusional thinking. The patient does report some mild anxiety symptoms today but denies any medication related side effects or other concerns.  The patient states that he feels ready for discharge to outpatient follow up and this will be ordered today at his request.  Treatment Plan  Summary:  1. Daily contact with patient to assess and evaluate symptoms and progress in treatment.  2. Medication management  3. The patient will deny suicidal ideations or homicidal ideations for 48 hours prior to discharge and have a depression and anxiety rating of 3 or less. The patient will also deny any auditory or visual hallucinations or delusional thinking.  4. The patient will deny any symptoms of substance withdrawal at time of discharge.  Plan:  1. Will continue the patient on the medication Zoloft at 50 mgs po qam for depression and anxiety.  2. Will continue the patient on the medication Zyprexa at 5 mgs po qhs for psychosis.  3. Will continue the patient on the  medication Neurontin at 300 mgs po q am, 2 pm and hs for anxiety.  4. Will continue the medication Trazodone at 50 mgs po qhs with a repeat x 1 for sleep.  5. Laboratory Studies reviewed.  6. Will continue to monitor.  7. The patient will be discharged today to outpatient follow up at his request.  Suicide Risk:  Minimal: No identifiable suicidal ideation. Patients presenting with no risk factors but with morbid ruminations; may be classified as minimal risk based on the severity of the depressive symptoms  Plan Of Care/Follow-up recommendations:  Activity: As tolerated.  Diet: Regular Diet.  Other: Please take all medications only as directed and keep all scheduled follow up appointments. Please return to your local Emergency Room should you have any thoughts of harmning yourself or others.  Discharge destination:  Home  Is patient on multiple antipsychotic therapies at discharge:  No  Has Patient had three or more failed trials of antipsychotic monotherapy by history: N/A Recommended Plan for Multiple Antipsychotic Therapies: N/A Discharge Orders    Future Orders Please Complete By Expires   Diet - low sodium heart healthy      Increase activity slowly      Discharge instructions      Comments:   Please take all  medications only as directed and keep all scheduled follow up appointments. Please return to your local Emergency Room should you have any thoughts of harming yourself or others.       Medication List     As of 06/30/2012  9:45 PM    STOP taking these medications         ALEVE 220 MG tablet   Generic drug: naproxen sodium      paliperidone 6 MG 24 hr tablet   Commonly known as: INVEGA      TAKE these medications      Indication    albuterol 108 (90 BASE) MCG/ACT inhaler   Commonly known as: PROVENTIL HFA;VENTOLIN HFA   Inhale 2 puffs into the lungs every 4 (four) hours as needed for wheezing or shortness of breath.       gabapentin 300 MG capsule   Commonly known as: NEURONTIN   Take 1 capsule (300 mg total) by mouth 3 (three) times daily at 8am, 2pm and bedtime. For anxiety and mood stabilization.       OLANZapine 5 MG tablet   Commonly known as: ZYPREXA   Take 1 tablet (5 mg total) by mouth at bedtime. For clarity of thought.       sertraline 50 MG tablet   Commonly known as: ZOLOFT   Take 1 tablet (50 mg total) by mouth daily. For depression and anxiety.       traZODone 50 MG tablet   Commonly known as: DESYREL   Take 1 tablet (50 mg total) by mouth at bedtime and may repeat dose one time if needed. For sleep.            Follow-up Information    Follow up with [facility]. Call on 06/24/2012. (Pt scheduled for 7:45am.  Patient referral number [phone].  Ask [facility] about the patient assistance program to ensure you are able to maintain medication with assistance of costs.)    Contact information:   [address] [place], [place] [address] [phone]        Follow-up recommendations:   Activities: Resume typical activities Diet: Resume typical diet Other: Follow up with outpatient provider and report any side effects to out  patient prescriber.  Comments:  Take all your medications as prescribed by your mental healthcare provider. Report any adverse effects and or reactions  from your medicines to your outpatient provider promptly. Patient is instructed and cautioned to not engage in alcohol and or illegal drug use while on prescription medicines. In the event of worsening symptoms, patient is instructed to call the crisis hotline, 911 and or go to the nearest ED for appropriate evaluation and treatment of symptoms. Follow-up with your primary care provider for your other medical issues, concerns and or health care needs.  Signed: Franchot Gallo 06/30/2012 9:45 PM

## 2012-12-18 ENCOUNTER — Encounter (HOSPITAL_COMMUNITY): Payer: Self-pay | Admitting: *Deleted

## 2012-12-18 ENCOUNTER — Emergency Department (HOSPITAL_COMMUNITY)
Admission: EM | Admit: 2012-12-18 | Discharge: 2012-12-18 | Disposition: A | Discharge status: Home / Self Care | Payer: Self-pay | Attending: Emergency Medicine | Admitting: Emergency Medicine

## 2012-12-18 DIAGNOSIS — Z79899 Other long term (current) drug therapy: Secondary | ICD-10-CM | POA: Insufficient documentation

## 2012-12-18 DIAGNOSIS — F209 Schizophrenia, unspecified: Secondary | ICD-10-CM | POA: Insufficient documentation

## 2012-12-18 DIAGNOSIS — F17200 Nicotine dependence, unspecified, uncomplicated: Secondary | ICD-10-CM | POA: Insufficient documentation

## 2012-12-18 DIAGNOSIS — J45909 Unspecified asthma, uncomplicated: Secondary | ICD-10-CM | POA: Insufficient documentation

## 2012-12-18 LAB — RAPID URINE DRUG SCREEN, HOSP PERFORMED
Barbiturates: NOT DETECTED
Benzodiazepines: NOT DETECTED
Cocaine: NOT DETECTED
Tetrahydrocannabinol: POSITIVE — AB

## 2012-12-18 LAB — BASIC METABOLIC PANEL
BUN: 9 mg/dL (ref 6–23)
Chloride: 98 mEq/L (ref 96–112)
Creatinine, Ser: 0.98 mg/dL (ref 0.50–1.35)
GFR calc Af Amer: >90 mL/min (ref >90)
Glucose, Bld: 89 mg/dL (ref 70–99)
Potassium: 4 mEq/L (ref 3.5–5.1)

## 2012-12-18 LAB — CBC
HCT: 44.2 % (ref 39.0–52.0)
Hemoglobin: 16.1 g/dL (ref 13.0–17.0)
MCHC: 36.4 g/dL — ABNORMAL HIGH (ref 30.0–36.0)
MCV: 87.4 fL (ref 78.0–100.0)
RDW: 12.4 % (ref 11.5–15.5)

## 2012-12-18 LAB — SALICYLATE LEVEL: Salicylate Lvl: <2 mg/dL — ABNORMAL LOW (ref 2.8–20.0)

## 2012-12-18 NOTE — ED Provider Notes (Signed)
Pt feels improved He denies SI/HI He denies hallucinations and he does not appear psychotic He reports mild wheezing (reports h/o asthma and has albuterol at home and is in no distress) He can be seen at [facility] today or tomorrow to restart meds, and has been seen by [facility] to arrange this Pt appears stable for d/c at this time BP 144/86  Pulse 82  Temp(Src) 97.5 F (36.4 C) (Oral)  Resp 20  Ht 5\' 3"  (1.6 m)  Wt 140 lb (63.504 kg)  BMI 24.81 kg/m2  SpO2 100% Labs reviewed  Joya Gaskins, MD 12/18/12 1004

## 2012-12-18 NOTE — ED Notes (Signed)
Mother left to go home.  Patient more argumentative with her prior to her leaving.

## 2012-12-18 NOTE — ED Notes (Signed)
Pt to department via EMS.  Per report, pt took 10 motrin approximately 8 hours ago.  Pt did state that he was trying to harm himself due to depression.  Pt also has history of schizophrenia and has not taken medication in "quite some time".

## 2012-12-18 NOTE — ED Provider Notes (Signed)
History     CSN: [id number]  Arrival date & time 12/18/12  0307   First MD Initiated Contact with Patient 12/18/12 0423      Chief Complaint  Patient presents with  . V70.1    (Consider location/radiation/quality/duration/timing/severity/associated sxs/prior treatment) HPI Louis Scott is a 27 y.o. male who presents to the Emergency Department complaining of  Having taken 10 200 mg ibuprofen 8 hours prior to coming to the ER. He is a schizophrenic who has not been on medicines in over 6 months. His mother took his action as a suicide attempt and brought him to the ER. The patient denies he was trying to harm himself. He denies homicidal ideation. He hears voices occasionally.   PCP Dr. Tiburcio Pea Past Medical History  Diagnosis Date  . Asthma   . Hallucination, visual   . Schizophrenic disorder     Past Surgical History  Procedure Laterality Date  . No past surgeries      History reviewed. No pertinent family history.  History  Substance Use Topics  . Smoking status: Current Every Day Smoker -- 1.00 packs/day for 10 years    Types: Cigarettes  . Smokeless tobacco: Not on file  . Alcohol Use: Yes     Comment: 6 pk beer weekly      Review of Systems  Constitutional: Negative for fever.       10 Systems reviewed and are negative for acute change except as noted in the HPI.  HENT: Negative for congestion.   Eyes: Negative for discharge and redness.  Respiratory: Negative for cough and shortness of breath.   Cardiovascular: Negative for chest pain.  Gastrointestinal: Negative for vomiting and abdominal pain.  Musculoskeletal: Negative for back pain.  Skin: Negative for rash.  Neurological: Negative for syncope, numbness and headaches.  Psychiatric/Behavioral:       Took ibuprofen    Allergies  Review of patient's allergies indicates no known allergies.  Home Medications   Current Outpatient Rx  Name  Route  Sig  Dispense  Refill  . albuterol (PROVENTIL  HFA;VENTOLIN HFA) 108 (90 BASE) MCG/ACT inhaler   Inhalation   Inhale 2 puffs into the lungs every 4 (four) hours as needed for wheezing or shortness of breath.   18 g   0   . gabapentin (NEURONTIN) 300 MG capsule   Oral   Take 1 capsule (300 mg total) by mouth 3 (three) times daily at 8am, 2pm and bedtime. For anxiety and mood stabilization.   90 capsule   0   . EXPIRED: OLANZapine (ZYPREXA) 5 MG tablet   Oral   Take 1 tablet (5 mg total) by mouth at bedtime. For clarity of thought.   30 tablet   0   . sertraline (ZOLOFT) 50 MG tablet   Oral   Take 1 tablet (50 mg total) by mouth daily. For depression and anxiety.   30 tablet   0   . EXPIRED: traZODone (DESYREL) 50 MG tablet   Oral   Take 1 tablet (50 mg total) by mouth at bedtime and may repeat dose one time if needed. For sleep.   60 tablet   0     BP 144/86  Pulse 82  Temp(Src) 97.5 F (36.4 C) (Oral)  Resp 20  Ht 5\' 3"  (1.6 m)  Wt 140 lb (63.504 kg)  BMI 24.81 kg/m2  SpO2 100%  Physical Exam  Nursing note and vitals reviewed. Constitutional: He is oriented to person, place, and  time. He appears well-developed and well-nourished.  Awake, alert, nontoxic appearance.  HENT:  Head: Normocephalic and atraumatic.  Right Ear: External ear normal.  Left Ear: External ear normal.  Mouth/Throat: Oropharynx is clear and moist.  Eyes: EOM are normal. Pupils are equal, round, and reactive to light. Right eye exhibits no discharge. Left eye exhibits no discharge.  Neck: Normal range of motion. Neck supple.  Cardiovascular: Normal rate and intact distal pulses.   Pulmonary/Chest: Effort normal and breath sounds normal. He exhibits no tenderness.  Abdominal: Soft. Bowel sounds are normal. There is no tenderness. There is no rebound.  Musculoskeletal: Normal range of motion. He exhibits no tenderness.  Baseline ROM, no obvious new focal weakness.  Neurological: He is alert and oriented to person, place, and time.   Mental status and motor strength appears baseline for patient and situation.  Skin: No rash noted.  Psychiatric: He has a normal mood and affect.  He denies suicidal thoughts, homicidal thoughts, AVH.    ED Course  Procedures (including critical care time) Results for orders placed during the hospital encounter of 12/18/12  CBC      Result Value Range   WBC 5.3  4.0 - 10.5 K/uL   RBC 5.06  4.22 - 5.81 MIL/uL   Hemoglobin 16.1  13.0 - 17.0 g/dL   HCT 16.1  09.6 - 04.5 %   MCV 87.4  78.0 - 100.0 fL   MCH 31.8  26.0 - 34.0 pg   MCHC 36.4 (*) 30.0 - 36.0 g/dL   RDW 40.9  81.1 - 91.4 %   Platelets 233  150 - 400 K/uL  BASIC METABOLIC PANEL      Result Value Range   Sodium 136  135 - 145 mEq/L   Potassium 4.0  3.5 - 5.1 mEq/L   Chloride 98  96 - 112 mEq/L   CO2 29  19 - 32 mEq/L   Glucose, Bld 89  70 - 99 mg/dL   BUN 9  6 - 23 mg/dL   Creatinine, Ser 7.82  0.50 - 1.35 mg/dL   Calcium 9.8  8.4 - 95.6 mg/dL   GFR calc non Af Amer >90  >90 mL/min   GFR calc Af Amer >90  >90 mL/min  ETHANOL      Result Value Range   Alcohol, Ethyl (B) <11  0 - 11 mg/dL  URINE RAPID DRUG SCREEN (HOSP PERFORMED)      Result Value Range   Opiates NONE DETECTED  NONE DETECTED   Cocaine NONE DETECTED  NONE DETECTED   Benzodiazepines NONE DETECTED  NONE DETECTED   Amphetamines NONE DETECTED  NONE DETECTED   Tetrahydrocannabinol POSITIVE (*) NONE DETECTED   Barbiturates NONE DETECTED  NONE DETECTED  SALICYLATE LEVEL      Result Value Range   Salicylate Lvl <2.0 (*) 2.8 - 20.0 mg/dL  ACETAMINOPHEN LEVEL      Result Value Range   Acetaminophen (Tylenol), Serum <15.0  10 - 30 ug/mL       MDM  Patient admitted to taking 10 200 mg ibuprofen not in a suicidal attempt. His mother made him come to the ER. He is here voluntarily and is not hearing voices. He will need to be seen by the [facility] team. MDM Reviewed: nursing note and vitals Interpretation: labs           [facility]. Colon Branch,  MD 12/18/12 [phone]

## 2012-12-18 NOTE — ED Notes (Signed)
Pt discharged with mother. To follow up with [facility].

## 2012-12-18 NOTE — ED Notes (Signed)
Patient reports taking 10 200 mg ibuprofen at approximately 1730 today. Also reports he is hearing voices at times. States has been off his medications for about 6 months. States hasn't eaten in 3 days.

## 2012-12-18 NOTE — ED Notes (Addendum)
Unable to provide urine specimen at present.  States if he has something to drink he might be able to do so.  Water given to drink.

## 2012-12-18 NOTE — ED Notes (Signed)
Pt currently denies SI/HI. When asked why he took so many ibuprofen, he stated he was just hurting really bad, "trying to get rid of the pain" (to stomach and head). Describes pain to head as, "it feels like my brain is lacking oxygen, causing the pain sometimes." Pt is very calm and cooperative. States he feels very safe to go home and would like to do so. explain that an [facility] member would be in to see him later this morning.

## 2012-12-18 NOTE — BH Assessment (Signed)
Assessment Note   Louis Scott is an 27 y.o. male. The patient was brought to the ED by his mother. She thought he may have been suicidal. He had taken (10) 200 mg Ibuprofen about 8  Hours before coming to the ED. The patient denies this was a suicide gesture. He states he took them because he had a sever head ache. He reports only 1 previous suicide gesture in the distant past. He denies homicidal thoughts and has no history of violence. He reports hearing noises on occassions but not constant. He feels he is begining to become psychotic again. He was at [facility] about a years ago and did well for 6 months with follow up at [facility]. He stopped his medications about 6 months ago because of the cost of the medications. He reports that he uses marijuana only 1-2 times a month, and this is when he is with someone who is also using. He can contract for safety. His mother is in agreement with his discharge and follow up with [facility]. Dr Bebe Shaggy spoke with the patient and will discharge the patient to follow u[ at [facility].  Axis I: Schizoaffective Disorder Axis II: Deferred Axis III:  Past Medical History  Diagnosis Date  . Asthma   . Hallucination, visual   . Schizophrenic disorder    Axis IV: economic problems, housing problems, problems with access to health care services and problems with primary support group Axis V: 31-40 impairment in reality testing  Past Medical History:  Past Medical History  Diagnosis Date  . Asthma   . Hallucination, visual   . Schizophrenic disorder     Past Surgical History  Procedure Laterality Date  . No past surgeries      Family History: History reviewed. No pertinent family history.  Social History:  reports that he has been smoking Cigarettes.  He has a 10 pack-year smoking history. He does not have any smokeless tobacco history on file. He reports that  drinks alcohol. He reports that he uses illicit drugs (Marijuana).  Additional Social History:      CIWA: CIWA-Ar BP: 144/86 mmHg Pulse Rate: 82 COWS:    Allergies: No Known Allergies  Home Medications:  (Not in a hospital admission)  OB/GYN Status:  No LMP for male patient.  General Assessment Data Location of Assessment: [facility] ACT Assessment: Yes Living Arrangements: Spouse/significant other;Parent Can pt return to current living arrangement?: Yes Admission Status: Voluntary Is patient capable of signing voluntary admission?: Yes Transfer from: Acute Hospital Referral Source: MD  Education Status Is patient currently in school?: No  Risk to self Suicidal Ideation: No Suicidal Intent: No Is patient at risk for suicide?: No Suicidal Plan?: No Access to Means: No What has been your use of drugs/alcohol within the last 12 months?: occassional marijuana Previous Attempts/Gestures: Yes How many times?: 1 Other Self Harm Risks: denies Triggers for Past Attempts: None known Intentional Self Injurious Behavior: None Family Suicide History: No Recent stressful life event(s): Conflict (Comment);Turmoil (Comment);Financial Problems Persecutory voices/beliefs?: No Depression: Yes Depression Symptoms: Tearfulness;Loss of interest in usual pleasures;Feeling worthless/self pity Substance abuse history and/or treatment for substance abuse?: Yes Suicide prevention information given to non-admitted patients: Yes  Risk to Others Homicidal Ideation: No Thoughts of Harm to Others: No Current Homicidal Intent: No Current Homicidal Plan: No Access to Homicidal Means: No History of harm to others?: No Assessment of Violence: None Noted Does patient have access to weapons?: No Criminal Charges Pending?: No  Does patient have a court date: No  Psychosis Hallucinations: Auditory (occassionallly) Delusions: None noted  Mental Status Report Appear/Hygiene: Improved Eye Contact: Good Motor Activity: Restlessness;Freedom of movement Speech: Logical/coherent Level of  Consciousness: Alert;Restless Mood: Depressed;Anxious Affect: Anxious;Depressed Anxiety Level: Moderate Thought Processes: Coherent;Relevant Judgement: Unimpaired Orientation: Person;Place;Time Obsessive Compulsive Thoughts/Behaviors: Minimal  Cognitive Functioning Concentration: Normal Memory: Recent Intact;Remote Intact IQ: Average Insight: Fair Impulse Control: Fair Appetite: Poor Weight Loss: 0 Weight Gain: 0 Sleep: No Change Vegetative Symptoms: None  ADLScreening [facility]) Patient's cognitive ability adequate to safely complete daily activities?: Yes Patient able to express need for assistance with ADLs?: Yes Independently performs ADLs?: Yes (appropriate for developmental age)  Abuse/Neglect [facility]) Physical Abuse: Denies Verbal Abuse: Denies Sexual Abuse: Denies  Prior Inpatient Therapy Prior Inpatient Therapy: Yes Prior Therapy Dates: 2013 Prior Therapy Facilty/Alycea Segoviano(s): [facility] Reason for Treatment: psychosis  Prior Outpatient Therapy Prior Outpatient Therapy: Yes Prior Therapy Dates: 2013 Prior [facility] Facilty/Ene[facility] Evers[facility]): [facility]  Reason for Treatment: medicatiopns  ADL Screening (condition at time of admission) Patient's cognitive ability adequate to safely complete daily activities?: Yes Patient able to express need for assistance with ADLs?: Yes Independently performs ADLs?: Yes (appropriate for developmental age)       Abuse/Neglect Assessment (Assessment to be complete while patient is alone) Physical Abuse: Denies Verbal Abuse: Denies Sexual Abuse: Denies Values / Beliefs Cultural Requests During Hospitalization: None        Additional Information 1:1 In Past 12 Months?: No CIRT Risk: No Elopement Risk: No Does patient have medical clearance?: Yes     Disposition: PATIENT DISCHARGED TO FOLLOW UP WITH [facility] TODAY AT 1 PM OR 12/19/2012 AT 8 AM. Disposition Initial Assessment Completed: Yes Disposition of Patient:  Outpatient treatment Type of outpatient treatment: Adult  On Site Evaluation by:   Reviewed with Physician:     Jearld Pies 12/18/2012 10:03 AM

## 2012-12-18 NOTE — ED Notes (Signed)
Patient states he was not trying to kill himself, just "so tired of the misery".  Thinks he will be ok if he just gets back on his medication.  Does not want to be sent to a psychiatric hospital.  Advised we are clearing him medically and the doctor will speak with him again.   Mother here sitting with patient (was wanded by security prior per policy)   Patient observed to appear irritated at times when speaking to his mother.  Mother calm / quiet

## 2014-01-14 ENCOUNTER — Encounter (HOSPITAL_COMMUNITY): Payer: Self-pay | Admitting: Emergency Medicine

## 2014-01-14 ENCOUNTER — Emergency Department (HOSPITAL_COMMUNITY)
Admission: EM | Admit: 2014-01-14 | Discharge: 2014-01-14 | Disposition: A | Discharge status: Home / Self Care | Payer: BC Managed Care – PPO | Attending: Emergency Medicine | Admitting: Emergency Medicine

## 2014-01-14 DIAGNOSIS — M545 Low back pain, unspecified: Secondary | ICD-10-CM

## 2014-01-14 DIAGNOSIS — J45909 Unspecified asthma, uncomplicated: Secondary | ICD-10-CM | POA: Insufficient documentation

## 2014-01-14 DIAGNOSIS — F2081 Schizophreniform disorder: Secondary | ICD-10-CM | POA: Insufficient documentation

## 2014-01-14 DIAGNOSIS — Z79899 Other long term (current) drug therapy: Secondary | ICD-10-CM | POA: Insufficient documentation

## 2014-01-14 MED ORDER — METHYLPREDNISOLONE 4 MG PO KIT: PACK | ORAL | Status: DC

## 2014-01-14 MED ORDER — PREDNISONE 50 MG PO TABS
60.0000 mg | ORAL_TABLET | Freq: Once | ORAL | Status: AC
  Administered 2014-01-14: 60 mg via ORAL
  Filled 2014-01-14 (×2): qty 1

## 2014-01-14 MED ORDER — CYCLOBENZAPRINE HCL 10 MG PO TABS: 10.0000 mg | ORAL_TABLET | Freq: Two times a day (BID) | ORAL | Status: DC | PRN

## 2014-01-14 MED ORDER — CYCLOBENZAPRINE HCL 10 MG PO TABS
10.0000 mg | ORAL_TABLET | Freq: Once | ORAL | Status: AC
  Administered 2014-01-14: 10 mg via ORAL
  Filled 2014-01-14: qty 1

## 2014-01-14 NOTE — ED Provider Notes (Signed)
CSN: [id number]     Arrival date & time 01/14/14  0418 History   First MD Initiated Contact with Patient 01/14/14 0449     Chief Complaint  Patient presents with  . Back Pain     (Consider location/radiation/quality/duration/timing/severity/associated sxs/prior Treatment) HPI History provided by patient. Low back pain. Has a history of back pain intermittently for the past few years. He has been bothering him more so her last few months. Patient states for the last 2 weeks he has been waking up with back pain, hurts to move, bend and twist. He is able to walk. He denies any weakness or numbness. No radiation of pain from his lower back. No associated fevers or chills. He is not diabetic. He denies any IV drug use. He attributes his pain to sleeping on a thin mattress while he was in prison for 4 months.    Past Medical History  Diagnosis Date  . Asthma   . Hallucination, visual   . Schizophrenic disorder    Past Surgical History  Procedure Laterality Date  . No past surgeries     No family history on file. History  Substance Use Topics  . Smoking status: Current Every Day Smoker -- 1.00 packs/day for 10 years    Types: Cigarettes  . Smokeless tobacco: Not on file  . Alcohol Use: Yes     Comment: 6 pk beer weekly    Review of Systems  Constitutional: Negative for fever and chills.  Respiratory: Negative for shortness of breath.   Cardiovascular: Negative for chest pain.  Gastrointestinal: Negative for abdominal pain.  Genitourinary: Negative for dysuria.  Musculoskeletal: Positive for back pain. Negative for neck pain.  Skin: Negative for rash.  Neurological: Negative for weakness and numbness.  All other systems reviewed and are negative.     Allergies  Review of patient's allergies indicates no known allergies.  Home Medications   Current Outpatient Rx  Name  Route  Sig  Dispense  Refill  . traZODone (DESYREL) 50 MG tablet   Oral   Take 1 tablet (50 mg total)  by mouth at bedtime and may repeat dose one time if needed. For sleep.   60 tablet   0   . EXPIRED: albuterol (PROVENTIL HFA;VENTOLIN HFA) 108 (90 BASE) MCG/ACT inhaler   Inhalation   Inhale 2 puffs into the lungs every 4 (four) hours as needed for wheezing or shortness of breath.   18 g   0   . EXPIRED: gabapentin (NEURONTIN) 300 MG capsule   Oral   Take 1 capsule (300 mg total) by mouth 3 (three) times daily at 8am, 2pm and bedtime. For anxiety and mood stabilization.   90 capsule   0   . EXPIRED: OLANZapine (ZYPREXA) 5 MG tablet   Oral   Take 1 tablet (5 mg total) by mouth at bedtime. For clarity of thought.   30 tablet   0   . EXPIRED: sertraline (ZOLOFT) 50 MG tablet   Oral   Take 1 tablet (50 mg total) by mouth daily. For depression and anxiety.   30 tablet   0    BP 136/90  Pulse 106  Temp(Src) 98.3 F (36.8 C) (Oral)  Resp 18  Ht 5\' 3"  (1.6 m)  Wt 140 lb (63.504 kg)  BMI 24.81 kg/m2  SpO2 100% Physical Exam  Constitutional: He is oriented to person, place, and time. He appears well-developed and well-nourished.  HENT:  Head: Normocephalic and atraumatic.  Eyes: EOM  are normal. Pupils are equal, round, and reactive to light.  Neck: Neck supple.  Cardiovascular: Normal rate, regular rhythm and intact distal pulses.   Pulmonary/Chest: Effort normal and breath sounds normal. No respiratory distress.  Abdominal: Soft. He exhibits no distension. There is no tenderness.  Musculoskeletal: Normal range of motion. He exhibits no edema.  Normal gait. Tender over lumbar spine, and more so over the paralumbar region. Equal DTRs at his patella. Sensorium to light touch grossly intact lower extremities. Negative straight leg raise. No lumbar deformity, erythema or swelling.   Neurological: He is alert and oriented to person, place, and time.  Skin: Skin is warm and dry.    ED Course  Procedures (including critical care time) Labs Review Labs Reviewed - No data to  display Imaging Review No results found.  Prednisone / Flexeril provided. Ice.  Plan discharge home with prescriptions for the same. Outpatient referral provided/ patient agrees to followup with primary care physician. Back pain precautions and instructions provided and verbalized is understood.  MDM   Diagnosis: Low back pain  No trauma or deficits or indication for emergent imaging at this time. Medications were provided. Vital signs and nursing notes reviewed and considered.    Sunnie NielsenBrian Synda Bagent, MD 01/14/14 [phone]

## 2014-01-14 NOTE — Discharge Instructions (Signed)
Back Pain, Adult Low back pain is very common. About 1 in 5 people have back pain.The cause of low back pain is rarely dangerous. The pain often gets better over time.About half of people with a sudden onset of back pain feel better in just 2 weeks. About 8 in 10 people feel better by 6 weeks.  CAUSES Some common causes of back pain include:  Strain of the muscles or ligaments supporting the spine.  Wear and tear (degeneration) of the spinal discs.  Arthritis.  Direct injury to the back. DIAGNOSIS Most of the time, the direct cause of low back pain is not known.However, back pain can be treated effectively even when the exact cause of the pain is unknown.Answering your caregiver's questions about your overall health and symptoms is one of the most accurate ways to make sure the cause of your pain is not dangerous. If your caregiver needs more information, he or she may order lab work or imaging tests (X-rays or MRIs).However, even if imaging tests show changes in your back, this usually does not require surgery. HOME CARE INSTRUCTIONS For many people, back pain returns.Since low back pain is rarely dangerous, it is often a condition that people can learn to manageon their own.   Remain active. It is stressful on the back to sit or stand in one place. Do not sit, drive, or stand in one place for more than 30 minutes at a time. Take short walks on level surfaces as soon as pain allows.Try to increase the length of time you walk each day.  Do not stay in bed.Resting more than 1 or 2 days can delay your recovery.  Do not avoid exercise or work.Your body is made to move.It is not dangerous to be active, even though your back may hurt.Your back will likely heal faster if you return to being active before your pain is gone.  Pay attention to your body when you bend and lift. Many people have less discomfortwhen lifting if they bend their knees, keep the load close to their bodies,and  avoid twisting. Often, the most comfortable positions are those that put less stress on your recovering back.  Find a comfortable position to sleep. Use a firm mattress and lie on your side with your knees slightly bent. If you lie on your back, put a pillow under your knees.  Only take over-the-counter or prescription medicines as directed by your caregiver. Over-the-counter medicines to reduce pain and inflammation are often the most helpful.Your caregiver may prescribe muscle relaxant drugs.These medicines help dull your pain so you can more quickly return to your normal activities and healthy exercise.  Put ice on the injured area.  Put ice in a plastic bag.  Place a towel between your skin and the bag.  Leave the ice on for 15-20 minutes, 03-04 times a day for the first 2 to 3 days. After that, ice and heat may be alternated to reduce pain and spasms.  Ask your caregiver about trying back exercises and gentle massage. This may be of some benefit.  Avoid feeling anxious or stressed.Stress increases muscle tension and can worsen back pain.It is important to recognize when you are anxious or stressed and learn ways to manage it.Exercise is a great option. SEEK MEDICAL CARE IF:  You have pain that is not relieved with rest or medicine.  You have pain that does not improve in 1 week.  You have new symptoms.  You are generally not feeling well. SEEK   IMMEDIATE MEDICAL CARE IF:   You have pain that radiates from your back into your legs.  You develop new bowel or bladder control problems.  You have unusual weakness or numbness in your arms or legs.  You develop nausea or vomiting.  You develop abdominal pain.  You feel faint. Document Released: 09/25/2005 Document Revised: 03/26/2012 Document Reviewed: 02/13/2011 Exit[employer] Patient Information 2014 [employer]. Back Exercises Back exercises help treat and prevent back injuries. The goal of back exercises is to increase  the strength of your abdominal and back muscles and the flexibility of your back. These exercises should be started when you no longer have back pain. Back exercises include:  Pelvic Tilt. Lie on your back with your knees bent. Tilt your pelvis until the lower part of your back is against the floor. Hold this position 5 to 10 sec and repeat 5 to 10 times.  Knee to Chest. Pull first 1 knee up against your chest and hold for 20 to 30 seconds, repeat this with the other knee, and then both knees. This may be done with the other leg straight or bent, whichever feels better.  Sit-Ups or Curl-Ups. Bend your knees 90 degrees. Start with tilting your pelvis, and do a partial, slow sit-up, lifting your trunk only 30 to 45 degrees off the floor. Take at least 2 to 3 seconds for each sit-up. Do not do sit-ups with your knees out straight. If partial sit-ups are difficult, simply do the above but with only tightening your abdominal muscles and holding it as directed.  Hip-Lift. Lie on your back with your knees flexed 90 degrees. Push down with your feet and shoulders as you raise your hips a couple inches off the floor; hold for 10 seconds, repeat 5 to 10 times.  Back arches. Lie on your stomach, propping yourself up on bent elbows. Slowly press on your hands, causing an arch in your low back. Repeat 3 to 5 times. Any initial stiffness and discomfort should lessen with repetition over time.  Shoulder-Lifts. Lie face down with arms beside your body. Keep hips and torso pressed to floor as you slowly lift your head and shoulders off the floor. Do not overdo your exercises, especially in the beginning. Exercises may cause you some mild back discomfort which lasts for a few minutes; however, if the pain is more severe, or lasts for more than 15 minutes, do not continue exercises until you see your caregiver. Improvement with exercise therapy for back problems is slow.  See your caregivers for assistance with  developing a proper back exercise program. Document Released: 11/02/2004 Document Revised: 12/18/2011 Document Reviewed: 07/27/2011 Exit[employer] Patient Information 2014 [employer].  

## 2014-01-14 NOTE — ED Notes (Signed)
Pt c/o back pain for a while that has gotten worse over last 2 weeks.

## 2014-08-09 ENCOUNTER — Emergency Department (HOSPITAL_COMMUNITY): Payer: BC Managed Care – PPO

## 2014-08-09 ENCOUNTER — Emergency Department (HOSPITAL_COMMUNITY)
Admission: EM | Admit: 2014-08-09 | Discharge: 2014-08-09 | Discharge status: Against Medical Advice | Payer: BC Managed Care – PPO | Attending: Emergency Medicine | Admitting: Emergency Medicine

## 2014-08-09 ENCOUNTER — Encounter (HOSPITAL_COMMUNITY): Payer: Self-pay | Admitting: Emergency Medicine

## 2014-08-09 DIAGNOSIS — W458XXA Other foreign body or object entering through skin, initial encounter: Secondary | ICD-10-CM | POA: Insufficient documentation

## 2014-08-09 DIAGNOSIS — S91342A Puncture wound with foreign body, left foot, initial encounter: Secondary | ICD-10-CM | POA: Insufficient documentation

## 2014-08-09 DIAGNOSIS — J45909 Unspecified asthma, uncomplicated: Secondary | ICD-10-CM | POA: Insufficient documentation

## 2014-08-09 DIAGNOSIS — M795 Residual foreign body in soft tissue: Secondary | ICD-10-CM

## 2014-08-09 DIAGNOSIS — Y929 Unspecified place or not applicable: Secondary | ICD-10-CM | POA: Insufficient documentation

## 2014-08-09 DIAGNOSIS — Z720 Tobacco use: Secondary | ICD-10-CM | POA: Insufficient documentation

## 2014-08-09 DIAGNOSIS — Y939 Activity, unspecified: Secondary | ICD-10-CM | POA: Insufficient documentation

## 2014-08-09 NOTE — ED Notes (Signed)
Stepped on something with left foot  Last night  and unsure if it is still in foot

## 2014-08-09 NOTE — ED Notes (Signed)
Pt called for room placement with no response. 

## 2014-10-20 ENCOUNTER — Emergency Department (HOSPITAL_COMMUNITY): Payer: Self-pay

## 2014-10-20 ENCOUNTER — Encounter (HOSPITAL_COMMUNITY): Payer: Self-pay

## 2014-10-20 ENCOUNTER — Emergency Department (HOSPITAL_COMMUNITY)
Admission: EM | Admit: 2014-10-20 | Discharge: 2014-10-21 | Disposition: A | Discharge status: Home / Self Care | Payer: Self-pay | Attending: Emergency Medicine | Admitting: Emergency Medicine

## 2014-10-20 DIAGNOSIS — R109 Unspecified abdominal pain: Secondary | ICD-10-CM

## 2014-10-20 DIAGNOSIS — R000 Tachycardia, unspecified: Secondary | ICD-10-CM | POA: Insufficient documentation

## 2014-10-20 DIAGNOSIS — J45909 Unspecified asthma, uncomplicated: Secondary | ICD-10-CM | POA: Insufficient documentation

## 2014-10-20 DIAGNOSIS — R079 Chest pain, unspecified: Secondary | ICD-10-CM | POA: Insufficient documentation

## 2014-10-20 DIAGNOSIS — R197 Diarrhea, unspecified: Secondary | ICD-10-CM | POA: Insufficient documentation

## 2014-10-20 DIAGNOSIS — Z720 Tobacco use: Secondary | ICD-10-CM | POA: Insufficient documentation

## 2014-10-20 DIAGNOSIS — N179 Acute kidney failure, unspecified: Secondary | ICD-10-CM | POA: Insufficient documentation

## 2014-10-20 DIAGNOSIS — Z79899 Other long term (current) drug therapy: Secondary | ICD-10-CM | POA: Insufficient documentation

## 2014-10-20 DIAGNOSIS — F209 Schizophrenia, unspecified: Secondary | ICD-10-CM | POA: Insufficient documentation

## 2014-10-20 DIAGNOSIS — R112 Nausea with vomiting, unspecified: Secondary | ICD-10-CM | POA: Insufficient documentation

## 2014-10-20 DIAGNOSIS — R1031 Right lower quadrant pain: Secondary | ICD-10-CM | POA: Insufficient documentation

## 2014-10-20 LAB — CBC WITH DIFFERENTIAL/PLATELET
BASOS ABS: 0 10*3/uL (ref 0.0–0.1)
BASOS PCT: 0 % (ref 0–1)
EOS ABS: 0 10*3/uL (ref 0.0–0.7)
EOS PCT: 0 % (ref 0–5)
HCT: 50.7 % (ref 39.0–52.0)
Hemoglobin: 18.2 g/dL — ABNORMAL HIGH (ref 13.0–17.0)
Lymphocytes Relative: 2 % — ABNORMAL LOW (ref 12–46)
Lymphs Abs: 0.3 10*3/uL — ABNORMAL LOW (ref 0.7–4.0)
MCH: 32.7 pg (ref 26.0–34.0)
MCHC: 35.9 g/dL (ref 30.0–36.0)
MCV: 91 fL (ref 78.0–100.0)
Monocytes Absolute: 0.5 10*3/uL (ref 0.1–1.0)
Monocytes Relative: 4 % (ref 3–12)
NEUTROS PCT: 94 % — AB (ref 43–77)
Neutro Abs: 13.1 10*3/uL — ABNORMAL HIGH (ref 1.7–7.7)
PLATELETS: 276 10*3/uL (ref 150–400)
RBC: 5.57 MIL/uL (ref 4.22–5.81)
RDW: 12.6 % (ref 11.5–15.5)
WBC: 13.9 10*3/uL — AB (ref 4.0–10.5)

## 2014-10-20 LAB — COMPREHENSIVE METABOLIC PANEL
ALT: 15 U/L (ref 0–53)
ANION GAP: 11 (ref 5–15)
AST: 32 U/L (ref 0–37)
Albumin: 4.6 g/dL (ref 3.5–5.2)
Alkaline Phosphatase: 57 U/L (ref 39–117)
BILIRUBIN TOTAL: 1.6 mg/dL — AB (ref 0.3–1.2)
BUN: 21 mg/dL (ref 6–23)
CALCIUM: 9.4 mg/dL (ref 8.4–10.5)
CHLORIDE: 104 meq/L (ref 96–112)
CO2: 22 mmol/L (ref 19–32)
CREATININE: 1.62 mg/dL — AB (ref 0.50–1.35)
GFR, EST AFRICAN AMERICAN: 65 mL/min — AB (ref >90)
GFR, EST NON AFRICAN AMERICAN: 56 mL/min — AB (ref >90)
GLUCOSE: 172 mg/dL — AB (ref 70–99)
POTASSIUM: 4.6 mmol/L (ref 3.5–5.1)
SODIUM: 137 mmol/L (ref 135–145)
Total Protein: 7.1 g/dL (ref 6.0–8.3)

## 2014-10-20 LAB — LIPASE, BLOOD: Lipase: 15 U/L (ref 11–59)

## 2014-10-20 MED ORDER — KETOROLAC TROMETHAMINE 30 MG/ML IJ SOLN
30.0000 mg | Freq: Once | INTRAMUSCULAR | Status: AC
  Administered 2014-10-20: 30 mg via INTRAVENOUS
  Filled 2014-10-20: qty 1

## 2014-10-20 MED ORDER — IOHEXOL 300 MG/ML  SOLN
100.0000 mL | Freq: Once | INTRAMUSCULAR | Status: AC | PRN
  Administered 2014-10-20: 100 mL via INTRAVENOUS

## 2014-10-20 MED ORDER — SODIUM CHLORIDE 0.9 % IV BOLUS (SEPSIS)
1000.0000 mL | Freq: Once | INTRAVENOUS | Status: AC
  Administered 2014-10-20: 1000 mL via INTRAVENOUS

## 2014-10-20 MED ORDER — IOHEXOL 300 MG/ML  SOLN
50.0000 mL | Freq: Once | INTRAMUSCULAR | Status: AC | PRN
  Administered 2014-10-20: 50 mL via ORAL

## 2014-10-20 MED ORDER — ONDANSETRON HCL 4 MG/2ML IJ SOLN
4.0000 mg | Freq: Once | INTRAMUSCULAR | Status: AC
  Administered 2014-10-20: 4 mg via INTRAVENOUS
  Filled 2014-10-20: qty 2

## 2014-10-20 MED ORDER — LOPERAMIDE HCL 2 MG PO CAPS
4.0000 mg | ORAL_CAPSULE | Freq: Once | ORAL | Status: AC
  Administered 2014-10-20: 4 mg via ORAL
  Filled 2014-10-20: qty 2

## 2014-10-20 MED ORDER — ACETAMINOPHEN 500 MG PO TABS
1000.0000 mg | ORAL_TABLET | Freq: Once | ORAL | Status: AC
  Administered 2014-10-20: 1000 mg via ORAL
  Filled 2014-10-20: qty 2

## 2014-10-20 NOTE — ED Provider Notes (Signed)
TIME SEEN: 9:28 PM  CHIEF COMPLAINT: Emesis  HPI: Pt is a 29 y.o. M with h/o asthma and schizophrenia who presents to the ED with vomiting with onset this morning at 8 AM.  Pt notes associated subjective fever, body aches, diarrhea, abdominal pain, chest pain from vomiting, hematochezia, generalized weakness, and inability to sleep. Pt denies travel outside of the country. Denies sick contacts. Pt denies dysuria or hematuria. No testicular pain or swelling. No penile discharge. No melena. No prior abdominal surgery. No shortness of breath. No history of PE or DVT, recent surgery or fracture, trauma, prolonged immobilization such as long flight or hospitalization.   ROS: See HPI Constitutional:  See HPI Eyes: no drainage  ENT: no runny nose   Cardiovascular:  See HPI Resp: no SOB; no cough  GI: See HPI GU: no dysuria Integumentary: no rash  Allergy: no hives  Musculoskeletal: no leg swelling  Neurological: no slurred speech ROS otherwise negative  PAST MEDICAL HISTORY/PAST SURGICAL HISTORY:  Past Medical History  Diagnosis Date  . Asthma   . Hallucination, visual   . Schizophrenic disorder     MEDICATIONS:  Prior to Admission medications   Medication Sig Start Date End Date Taking? Authorizing Provider  albuterol (PROVENTIL HFA;VENTOLIN HFA) 108 (90 BASE) MCG/ACT inhaler Inhale 2 puffs into the lungs every 4 (four) hours as needed for wheezing or shortness of breath. 06/20/12 06/20/13  Curlene Labrumandy D Readling, MD  cyclobenzaprine (FLEXERIL) 10 MG tablet Take 1 tablet (10 mg total) by mouth 2 (two) times daily as needed for muscle spasms. 01/14/14   Sunnie NielsenBrian Opitz, MD  gabapentin (NEURONTIN) 300 MG capsule Take 1 capsule (300 mg total) by mouth 3 (three) times daily at 8am, 2pm and bedtime. For anxiety and mood stabilization. 06/20/12 06/20/13  Ronny Baconandy D Readling, MD  methylPREDNISolone (MEDROL DOSEPAK) 4 MG tablet Use as directed 01/14/14   Sunnie NielsenBrian Opitz, MD  OLANZapine (ZYPREXA) 5 MG tablet Take 1  tablet (5 mg total) by mouth at bedtime. For clarity of thought. 06/20/12 07/20/12  Curlene Labrumandy D Readling, MD  sertraline (ZOLOFT) 50 MG tablet Take 1 tablet (50 mg total) by mouth daily. For depression and anxiety. 06/20/12 06/20/13  Ronny Baconandy D Readling, MD  traZODone (DESYREL) 50 MG tablet Take 1 tablet (50 mg total) by mouth at bedtime and may repeat dose one time if needed. For sleep. 06/20/12 01/14/14  Ronny Baconandy D Readling, MD    ALLERGIES:  No Known Allergies  SOCIAL HISTORY:  History  Substance Use Topics  . Smoking status: Current Every Day Smoker -- 1.00 packs/day for 10 years    Types: Cigarettes  . Smokeless tobacco: Not on file  . Alcohol Use: Yes     Comment: 6 pk beer weekly    FAMILY HISTORY: History reviewed. No pertinent family history.  EXAM: BP 109/74 mmHg  Pulse 136  Temp(Src) 97.7 F (36.5 C) (Oral)  Ht 5\' 5"  (1.651 m)  Wt 140 lb (63.504 kg)  BMI 23.30 kg/m2  SpO2 100% CONSTITUTIONAL: Alert and oriented and responds appropriately to questions. Well-appearing; well-nourished, appears uncomfortable but is nontoxic HEAD: Normocephalic EYES: Conjunctivae clear, PERRL ENT: normal nose; no rhinorrhea; moist mucous membranes; pharynx without lesions noted NECK: Supple, no meningismus, no LAD  CARD: Regular and tachycardic; S1 and S2 appreciated; no murmurs, no clicks, no rubs, no gallops RESP: Normal chest excursion without splinting or tachypnea; breath sounds clear and equal bilaterally; no wheezes, no rhonchi, no rales, no hypoxia or respiratory distress ABD/GI/RECTAL: Normal bowel  sounds; non-distended; diffuse tenderness, right middle and RLQ TTP with no rebound, minimal guarding; no peritoneal signs, no gross melena, no hemorrhoids, no rectal tone, minimally guaiac positive; normal rectal tone BACK:  The back appears normal and is non-tender to palpation, there is no CVA tenderness EXT: Normal ROM in all joints; non-tender to palpation; no edema; normal capillary refill; no  cyanosis; no calf tenderness or swelling    SKIN: Normal color for age and race; warm NEURO: Moves all extremities equally PSYCH: The patient's mood and manner are appropriate. Grooming and personal hygiene are appropriate.  MEDICAL DECISION MAKING: Patient here with multiple complaints but mostly vomiting, diarrhea or abdominal pain. He is also complaining of body aches and chest pain after vomiting. He is tachycardic on exam but afebrile. No shortness of breath. No risk factors for pulmonary embolus or DVT. Does appear dry. We'll give IV fluids, Zofran, Toradol and reassess. We'll check rectal temperature, abdominal labs. Will obtain an EKG and chest x-ray given his complaints of chest pain. We'll also obtain a CT of his abdomen and pelvis given he is tender to palpation of the right middle and lower quadrant with voluntary guarding.  His abdomen however is nonsurgical.    ED PROGRESS: Patient reports feeling much better. His pain is now completely gone. He is tolerating by mouth without further vomiting or diarrhea. Labs do show mild leukocytosis with left shift and mild elevation of his creatinine at 1.69 but he is receiving his third liter of IV fluids and is tolerating oral liquids. He is still mildly tachycardic but on review of his records back to 2013 this is chronic for him. He states he has been told this in the past. He reports that he would like to be discharged home. His CT scan shows mild possible inflammation of the sigmoid colon but this may also be normal. He did have diffuse abdominal tenderness exam but was worse on the right side. He does have some bloody stools and is minimally guaiac positive. We'll start on Cipro and Flagyl for the next 10 days. We'll give first dose in the ED. Have advised him to follow-up with his primary care physician. Have discussed this finding with the patient who agrees.  Urine shows no sign of infection.  I don't think he has a pulmonary embolus as his  chest pain is now gone and he has no shortness of breath, hypoxia. Tachycardia is chronic. Chest x-ray clear.   EKG Interpretation  Date/Time:  Tuesday October 20 2014 21:50:56 EST Ventricular Rate:  110 PR Interval:  138 QRS Duration: 89 QT Interval:  305 QTC Calculation: 412 R Axis:   68 Text Interpretation:  Sinus tachycardia RAE, consider biatrial enlargement RSR' in V1 or V2, right VCD or RVH ST elev, probable normal early repol pattern Baseline wander in lead(s) V3 No old tracing to compare Confirmed by WARD,  DO, KRISTEN ([id number]) on 10/20/2014 9:57:03 PM        I personally performed the services described in this documentation, which was scribed in my presence. The recorded information has been reviewed and is accurate.    Layla Maw Ward, DO 10/21/14 [id number]

## 2014-10-20 NOTE — ED Notes (Signed)
Patient complaining of vomiting and diarrhea that started today at 0800. Emesis X10 and diarrhea X10 in the past 24 hours per patient. Patient also c/o general body aches.

## 2014-10-21 LAB — URINALYSIS, ROUTINE W REFLEX MICROSCOPIC
GLUCOSE, UA: NEGATIVE mg/dL
HGB URINE DIPSTICK: NEGATIVE
LEUKOCYTES UA: NEGATIVE
Nitrite: NEGATIVE
PROTEIN: NEGATIVE mg/dL
Specific Gravity, Urine: 1.01 (ref 1.005–1.030)
Urobilinogen, UA: 0.2 mg/dL (ref 0.0–1.0)
pH: 6.5 (ref 5.0–8.0)

## 2014-10-21 LAB — POC OCCULT BLOOD, ED: FECAL OCCULT BLD: POSITIVE — AB

## 2014-10-21 MED ORDER — CIPROFLOXACIN HCL 500 MG PO TABS: 500.0000 mg | ORAL_TABLET | Freq: Two times a day (BID) | ORAL | Status: DC

## 2014-10-21 MED ORDER — LOPERAMIDE HCL 2 MG PO CAPS: 2.0000 mg | ORAL_CAPSULE | Freq: Four times a day (QID) | ORAL | Status: DC | PRN

## 2014-10-21 MED ORDER — ONDANSETRON 4 MG PO TBDP: 4.0000 mg | ORAL_TABLET | Freq: Three times a day (TID) | ORAL | Status: DC | PRN

## 2014-10-21 MED ORDER — METRONIDAZOLE 500 MG PO TABS: 500.0000 mg | ORAL_TABLET | Freq: Two times a day (BID) | ORAL | Status: DC

## 2014-10-21 MED ORDER — DICYCLOMINE HCL 20 MG PO TABS
20.0000 mg | ORAL_TABLET | Freq: Three times a day (TID) | ORAL | Status: DC
Start: 2014-10-21 — End: 2014-12-26

## 2014-10-21 MED ORDER — METRONIDAZOLE 500 MG PO TABS
500.0000 mg | ORAL_TABLET | Freq: Once | ORAL | Status: AC
  Administered 2014-10-21: 500 mg via ORAL
  Filled 2014-10-21: qty 1

## 2014-10-21 MED ORDER — CIPROFLOXACIN HCL 250 MG PO TABS
500.0000 mg | ORAL_TABLET | Freq: Once | ORAL | Status: AC
  Administered 2014-10-21: 500 mg via ORAL
  Filled 2014-10-21: qty 2

## 2014-10-21 NOTE — Discharge Instructions (Signed)
Your labs today showed that your kidney function was mildly elevated with a creatinine of 1.69. This is likely secondary to dehydration from vomiting and diarrhea. I recommend you continue to drink plenty of fluids including water and Gatorade and avoid drinks with caffeine. I recommended she have your kidney function rechecked in one week with your primary care physician. Your heart rate is also elevated but this appears to be chronic for you according to records in our system. I recommend you follow-up with your primary care physician as well for this. Your CT scan showed that you may have mild inflammation of the left lower part of your colon called colitis and we are starting you on antibiotics for this.  Viral Gastroenteritis Viral gastroenteritis is also known as stomach flu. This condition affects the stomach and intestinal tract. It can cause sudden diarrhea and vomiting. The illness typically lasts 3 to 8 days. Most people develop an immune response that eventually gets rid of the virus. While this natural response develops, the virus can make you quite ill. CAUSES  Many different viruses can cause gastroenteritis, such as rotavirus or noroviruses. You can catch one of these viruses by consuming contaminated food or water. You may also catch a virus by sharing utensils or other personal items with an infected person or by touching a contaminated surface. SYMPTOMS  The most common symptoms are diarrhea and vomiting. These problems can cause a severe loss of body fluids (dehydration) and a body salt (electrolyte) imbalance. Other symptoms may include:  Fever.  Headache.  Fatigue.  Abdominal pain. DIAGNOSIS  Your caregiver can usually diagnose viral gastroenteritis based on your symptoms and a physical exam. A stool sample may also be taken to test for the presence of viruses or other infections. TREATMENT  This illness typically goes away on its own. Treatments are aimed at rehydration. The  most serious cases of viral gastroenteritis involve vomiting so severely that you are not able to keep fluids down. In these cases, fluids must be given through an intravenous line (IV). HOME CARE INSTRUCTIONS   Drink enough fluids to keep your urine clear or pale yellow. Drink small amounts of fluids frequently and increase the amounts as tolerated.  Ask your caregiver for specific rehydration instructions.  Avoid:  Foods high in sugar.  Alcohol.  Carbonated drinks.  Tobacco.  Juice.  Caffeine drinks.  Extremely hot or cold fluids.  Fatty, greasy foods.  Too much intake of anything at one time.  Dairy products until 24 to 48 hours after diarrhea stops.  You may consume probiotics. Probiotics are active cultures of beneficial bacteria. They may lessen the amount and number of diarrheal stools in adults. Probiotics can be found in yogurt with active cultures and in supplements.  Wash your hands well to avoid spreading the virus.  Only take over-the-counter or prescription medicines for pain, discomfort, or fever as directed by your caregiver. Do not give aspirin to children. Antidiarrheal medicines are not recommended.  Ask your caregiver if you should continue to take your regular prescribed and over-the-counter medicines.  Keep all follow-up appointments as directed by your caregiver. SEEK IMMEDIATE MEDICAL CARE IF:   You are unable to keep fluids down.  You do not urinate at least once every 6 to 8 hours.  You develop shortness of breath.  You notice blood in your stool or vomit. This may look like coffee grounds.  You have abdominal pain that increases or is concentrated in one small area (localized).  You have persistent vomiting or diarrhea.  You have a fever.  The patient is a child younger than 3 months, and he or she has a fever.  The patient is a child older than 3 months, and he or she has a fever and persistent symptoms.  The patient is a child  older than 3 months, and he or she has a fever and symptoms suddenly get worse.  The patient is a baby, and he or she has no tears when crying. MAKE SURE YOU:   Understand these instructions.  Will watch your condition.  Will get help right away if you are not doing well or get worse. Document Released: 09/25/2005 Document Revised: 12/18/2011 Document Reviewed: 07/12/2011 [facility] Patient Information 2015 [place], [place]. This information is not intended to replace advice given to you by your health care provider. Make sure you discuss any questions you have with your health care provider.     Colitis Colitis is inflammation of the colon. Colitis can be a short-term or long-standing (chronic) illness. Crohn's disease and ulcerative colitis are 2 types of colitis which are chronic. They usually require lifelong treatment. CAUSES  There are many different causes of colitis, including:  Viruses.  Germs (bacteria).  Medicine reactions. SYMPTOMS   Diarrhea.  Intestinal bleeding.  Pain.  Fever.  Throwing up (vomiting).  Tiredness (fatigue).  Weight loss.  Bowel blockage. DIAGNOSIS  The diagnosis of colitis is based on examination and stool or blood tests. X-rays, CT scan, and colonoscopy may also be needed. TREATMENT  Treatment may include:  Fluids given through the vein (intravenously).  Bowel rest (nothing to eat or drink for a period of time).  Medicine for pain and diarrhea.  Medicines (antibiotics) that kill germs.  Cortisone medicines.  Surgery. HOME CARE INSTRUCTIONS   Get plenty of rest.  Drink enough water and fluids to keep your urine clear or pale yellow.  Eat a well-balanced diet.  Call your caregiver for follow-up as recommended. SEEK IMMEDIATE MEDICAL CARE IF:   You develop chills.  You have an oral temperature above 102 F (38.9 C), not controlled by medicine.  You have extreme weakness, fainting, or dehydration.  You have repeated  vomiting.  You develop severe belly (abdominal) pain or are passing bloody or tarry stools. MAKE SURE YOU:   Understand these instructions.  Will watch your condition.  Will get help right away if you are not doing well or get worse. Document Released: 11/02/2004 Document Revised: 12/18/2011 Document Reviewed: 01/28/2010 [facility] Patient Information 2015 [place], [place]. This information is not intended to replace advice given to you by your health care provider. Make sure you discuss any questions you have with your health care provider.

## 2014-10-21 NOTE — ED Notes (Signed)
Pt left ED, ambulatory with no signs of distress. Pt verbalized discharge instructions. 

## 2014-12-26 ENCOUNTER — Emergency Department (HOSPITAL_COMMUNITY)
Admission: EM | Admit: 2014-12-26 | Discharge: 2014-12-26 | Disposition: A | Discharge status: Home / Self Care | Payer: Self-pay | Attending: Emergency Medicine | Admitting: Emergency Medicine

## 2014-12-26 ENCOUNTER — Encounter (HOSPITAL_COMMUNITY): Payer: Self-pay | Admitting: Cardiology

## 2014-12-26 DIAGNOSIS — K625 Hemorrhage of anus and rectum: Secondary | ICD-10-CM | POA: Insufficient documentation

## 2014-12-26 DIAGNOSIS — Z79899 Other long term (current) drug therapy: Secondary | ICD-10-CM | POA: Insufficient documentation

## 2014-12-26 DIAGNOSIS — R112 Nausea with vomiting, unspecified: Secondary | ICD-10-CM | POA: Insufficient documentation

## 2014-12-26 DIAGNOSIS — J45909 Unspecified asthma, uncomplicated: Secondary | ICD-10-CM | POA: Insufficient documentation

## 2014-12-26 DIAGNOSIS — Z720 Tobacco use: Secondary | ICD-10-CM | POA: Insufficient documentation

## 2014-12-26 DIAGNOSIS — R1013 Epigastric pain: Secondary | ICD-10-CM

## 2014-12-26 DIAGNOSIS — F209 Schizophrenia, unspecified: Secondary | ICD-10-CM | POA: Insufficient documentation

## 2014-12-26 LAB — COMPREHENSIVE METABOLIC PANEL
ALT: 30 U/L (ref 0–53)
AST: 35 U/L (ref 0–37)
Albumin: 4.8 g/dL (ref 3.5–5.2)
Alkaline Phosphatase: 59 U/L (ref 39–117)
Anion gap: 7 (ref 5–15)
BUN: 15 mg/dL (ref 6–23)
CALCIUM: 10.1 mg/dL (ref 8.4–10.5)
CO2: 31 mmol/L (ref 19–32)
CREATININE: 1.14 mg/dL (ref 0.50–1.35)
Chloride: 101 mmol/L (ref 96–112)
GFR calc non Af Amer: 86 mL/min — ABNORMAL LOW (ref >90)
Glucose, Bld: 91 mg/dL (ref 70–99)
POTASSIUM: 4.7 mmol/L (ref 3.5–5.1)
SODIUM: 139 mmol/L (ref 135–145)
TOTAL PROTEIN: 7.9 g/dL (ref 6.0–8.3)
Total Bilirubin: 1 mg/dL (ref 0.3–1.2)

## 2014-12-26 LAB — CBC WITH DIFFERENTIAL/PLATELET
BASOS PCT: 0 % (ref 0–1)
Basophils Absolute: 0 10*3/uL (ref 0.0–0.1)
EOS PCT: 3 % (ref 0–5)
Eosinophils Absolute: 0.2 10*3/uL (ref 0.0–0.7)
HEMATOCRIT: 48.5 % (ref 39.0–52.0)
Hemoglobin: 17.7 g/dL — ABNORMAL HIGH (ref 13.0–17.0)
LYMPHS PCT: 17 % (ref 12–46)
Lymphs Abs: 1.3 10*3/uL (ref 0.7–4.0)
MCH: 33.5 pg (ref 26.0–34.0)
MCHC: 36.5 g/dL — ABNORMAL HIGH (ref 30.0–36.0)
MCV: 91.9 fL (ref 78.0–100.0)
Monocytes Absolute: 0.6 10*3/uL (ref 0.1–1.0)
Monocytes Relative: 8 % (ref 3–12)
NEUTROS PCT: 72 % (ref 43–77)
Neutro Abs: 5.4 10*3/uL (ref 1.7–7.7)
Platelets: 274 10*3/uL (ref 150–400)
RBC: 5.28 MIL/uL (ref 4.22–5.81)
RDW: 12.9 % (ref 11.5–15.5)
WBC: 7.5 10*3/uL (ref 4.0–10.5)

## 2014-12-26 LAB — LIPASE, BLOOD: LIPASE: 25 U/L (ref 11–59)

## 2014-12-26 MED ORDER — PROMETHAZINE HCL 25 MG PO TABS: 25.0000 mg | ORAL_TABLET | Freq: Four times a day (QID) | ORAL | Status: DC | PRN

## 2014-12-26 MED ORDER — ONDANSETRON 8 MG PO TBDP
8.0000 mg | ORAL_TABLET | Freq: Once | ORAL | Status: AC
  Administered 2014-12-26: 8 mg via ORAL
  Filled 2014-12-26: qty 1

## 2014-12-26 MED ORDER — RANITIDINE HCL 150 MG PO TABS: 150.0000 mg | ORAL_TABLET | Freq: Two times a day (BID) | ORAL | Status: DC

## 2014-12-26 NOTE — ED Notes (Signed)
C/o RUQ abdominal pain for the past few days.  Vomiting.  States vomited up some blood this morning.  Seen for same before and was to f/u and did not.

## 2014-12-26 NOTE — Discharge Instructions (Signed)
Abdominal Pain Many things can cause abdominal pain. Usually, abdominal pain is not caused by a disease and will improve without treatment. It can often be observed and treated at home. Your health care provider will do a physical exam and possibly order blood tests and X-rays to help determine the seriousness of your pain. However, in many cases, more time must pass before a clear cause of the pain can be found. Before that point, your health care provider may not know if you need more testing or further treatment. HOME CARE INSTRUCTIONS  Monitor your abdominal pain for any changes. The following actions may help to alleviate any discomfort you are experiencing:  Only take over-the-counter or prescription medicines as directed by your health care provider.  Do not take laxatives unless directed to do so by your health care provider.  Try a clear liquid diet (broth, tea, or water) as directed by your health care provider. Slowly move to a bland diet as tolerated. SEEK MEDICAL CARE IF:  You have unexplained abdominal pain.  You have abdominal pain associated with nausea or diarrhea.  You have pain when you urinate or have a bowel movement.  You experience abdominal pain that wakes you in the night.  You have abdominal pain that is worsened or improved by eating food.  You have abdominal pain that is worsened with eating fatty foods.  You have a fever. SEEK IMMEDIATE MEDICAL CARE IF:   Your pain does not go away within 2 hours.  You keep throwing up (vomiting).  Your pain is felt only in portions of the abdomen, such as the right side or the left lower portion of the abdomen.  You pass bloody or black tarry stools. MAKE SURE YOU:  Understand these instructions.   Will watch your condition.   Will get help right away if you are not doing well or get worse.  Document Released: 07/05/2005 Document Revised: 09/30/2013 Document Reviewed: 06/04/2013 [facility] Patient Information  2015 [place], [place]. This information is not intended to replace advice given to you by your health care provider. Make sure you discuss any questions you have with your health care provider.  Bloody Stools Bloody stools means there is blood in your poop (stool). It is a sign that there is a problem somewhere in the digestive system. It is important for your doctor to find the cause of your bleeding, so the problem can be treated.  HOME CARE  Only take medicine as told by your doctor.  Eat foods with fiber (prunes, bran cereals).  Drink enough fluids to keep your pee (urine) clear or pale yellow.  Sit in warm water (sitz bath) for 10 to 15 minutes as told by your doctor.  Know how to take your medicines (enemas, suppositories) if advised by your doctor.  Watch for signs that you are getting better or getting worse. GET HELP RIGHT AWAY IF:   You are not getting better.  You start to get better but then get worse again.  You have new problems.  You have severe bleeding from the place where poop comes out (rectum) that does not stop.  You throw up (vomit) blood.  You feel weak or pass out (faint).  You have a fever. MAKE SURE YOU:   Understand these instructions.  Will watch your condition.  Will get help right away if you are not doing well or get worse. Document Released: 09/13/2009 Document Revised: 12/18/2011 Document Reviewed: 02/10/2011 [facility] Patient Information 2015 [place], [place]. This  information is not intended to replace advice given to you by your health care provider. Make sure you discuss any questions you have with your health care provider. ° °

## 2014-12-26 NOTE — ED Provider Notes (Signed)
CSN: [id number]     Arrival date & time 12/26/14  1622 History   First MD Initiated Contact with Patient 12/26/14 1931     Chief Complaint  Patient presents with  . Abdominal Pain     (Consider location/radiation/quality/duration/timing/severity/associated sxs/prior Treatment) HPI Comments: Patient presents to the ER for evaluation of pain in his upper abdomen radiating up into his chest and throat. Symptoms have been ongoing for several days. Patient reports that he has been seen for similar symptoms in the past, was told to follow-up with a specialist but never did. Patient reports nausea and vomiting associated with symptoms. He saw a small amount of blood in his vomit today. He also has been passing small amounts of red blood in his stool.  Patient is a 29 y.o. male presenting with abdominal pain.  Abdominal Pain Associated symptoms: nausea and vomiting     Past Medical History  Diagnosis Date  . Asthma   . Hallucination, visual   . Schizophrenic disorder    Past Surgical History  Procedure Laterality Date  . No past surgeries     History reviewed. No pertinent family history. History  Substance Use Topics  . Smoking status: Current Every Day Smoker -- 1.00 packs/day for 10 years    Types: Cigarettes  . Smokeless tobacco: Not on file  . Alcohol Use: Yes     Comment: 6 pk beer weekly    Review of Systems  Gastrointestinal: Positive for nausea, vomiting, abdominal pain and blood in stool.  All other systems reviewed and are negative.     Allergies  Review of patient's allergies indicates no known allergies.  Home Medications   Prior to Admission medications   Medication Sig Start Date End Date Taking? Authorizing Provider  acetaminophen (TYLENOL) 500 MG tablet Take 1,000 mg by mouth every 6 (six) hours as needed for moderate pain or headache.   Yes Historical Provider, MD  albuterol (PROVENTIL HFA;VENTOLIN HFA) 108 (90 BASE) MCG/ACT inhaler Inhale 2 puffs into  the lungs every 4 (four) hours as needed for wheezing or shortness of breath. 06/20/12 12/26/14 Yes Curlene Labrum Readling, MD  gabapentin (NEURONTIN) 300 MG capsule Take 1 capsule (300 mg total) by mouth 3 (three) times daily at 8am, 2pm and bedtime. For anxiety and mood stabilization. Patient not taking: Reported on 12/26/2014 06/20/12 12/26/14  Curlene Labrum Readling, MD  loperamide (IMODIUM) 2 MG capsule Take 1 capsule (2 mg total) by mouth 4 (four) times daily as needed for diarrhea or loose stools. Patient not taking: Reported on 12/26/2014 10/21/14   Kristen N Ward, DO  OLANZapine (ZYPREXA) 5 MG tablet Take 1 tablet (5 mg total) by mouth at bedtime. For clarity of thought. Patient not taking: Reported on 10/20/2014 06/20/12 07/20/12  Curlene Labrum Readling, MD  promethazine (PHENERGAN) 25 MG tablet Take 1 tablet (25 mg total) by mouth every 6 (six) hours as needed for nausea or vomiting. 12/26/14   Gilda Crease, MD  ranitidine (ZANTAC) 150 MG tablet Take 1 tablet (150 mg total) by mouth 2 (two) times daily. 12/26/14   Gilda Crease, MD  sertraline (ZOLOFT) 50 MG tablet Take 1 tablet (50 mg total) by mouth daily. For depression and anxiety. Patient not taking: Reported on 10/20/2014 06/20/12 06/20/13  Curlene Labrum Readling, MD  traZODone (DESYREL) 50 MG tablet Take 1 tablet (50 mg total) by mouth at bedtime and may repeat dose one time if needed. For sleep. Patient not taking: Reported on 10/20/2014 06/20/12 01/14/14  Curlene Labrumandy D Readling, MD   BP 149/78 mmHg  Pulse 100  Temp(Src) 98 F (36.7 C) (Oral)  Resp 16  Ht 5\' 4"  (1.626 m)  Wt 145 lb (65.772 kg)  BMI 24.88 kg/m2  SpO2 100% Physical Exam  Constitutional: He is oriented to person, place, and time. He appears well-developed and well-nourished. No distress.  HENT:  Head: Normocephalic and atraumatic.  Right Ear: Hearing normal.  Left Ear: Hearing normal.  Nose: Nose normal.  Mouth/Throat: Oropharynx is clear and moist and mucous membranes are normal.   Eyes: Conjunctivae and EOM are normal. Pupils are equal, round, and reactive to light.  Neck: Normal range of motion. Neck supple.  Cardiovascular: Regular rhythm, S1 normal and S2 normal.  Exam reveals no gallop and no friction rub.   No murmur heard. Pulmonary/Chest: Effort normal and breath sounds normal. No respiratory distress. He exhibits no tenderness.  Abdominal: Soft. Normal appearance and bowel sounds are normal. There is no hepatosplenomegaly. There is no tenderness. There is no rebound, no guarding, no tenderness at McBurney's point and negative Murphy's sign. No hernia.  Musculoskeletal: Normal range of motion.  Neurological: He is alert and oriented to person, place, and time. He has normal strength. No cranial nerve deficit or sensory deficit. Coordination normal. GCS eye subscore is 4. GCS verbal subscore is 5. GCS motor subscore is 6.  Skin: Skin is warm, dry and intact. No rash noted. No cyanosis.  Psychiatric: He has a normal mood and affect. His speech is normal and behavior is normal. Thought content normal.  Nursing note and vitals reviewed.   ED Course  Procedures (including critical care time) Labs Review Labs Reviewed  CBC WITH DIFFERENTIAL/PLATELET - Abnormal; Notable for the following:    Hemoglobin 17.7 (*)    MCHC 36.5 (*)    All other components within normal limits  COMPREHENSIVE METABOLIC PANEL - Abnormal; Notable for the following:    GFR calc non Af Amer 86 (*)    All other components within normal limits  LIPASE, BLOOD    Imaging Review No results found.   EKG Interpretation None      MDM   Final diagnoses:  Epigastric pain  Rectal bleeding    Patient presents to the ER for evaluation of abdominal pain with nausea and vomiting. He reports a small amount of blood in his vomit and some passage of bright red blood from his rectum. Labs are unremarkable. Patient has been seen for similar symptoms in the past and was referred to  gastroenterology. He has neglected to follow-up. He was counseled that he needs to follow-up with gastroenterology to have upper and lower endoscopy to find the source of his symptoms and rule out significant causes of bleeding. He does not require hospitalization at this time, is stable for discharge.    Gilda Creasehristopher J Pollina, MD 12/27/14 [phone]

## 2014-12-28 ENCOUNTER — Emergency Department (HOSPITAL_COMMUNITY)
Admission: EM | Admit: 2014-12-28 | Discharge: 2014-12-28 | Discharge status: Against Medical Advice | Payer: Self-pay | Attending: Emergency Medicine | Admitting: Emergency Medicine

## 2014-12-28 ENCOUNTER — Encounter (HOSPITAL_COMMUNITY): Payer: Self-pay | Admitting: Emergency Medicine

## 2014-12-28 DIAGNOSIS — Z720 Tobacco use: Secondary | ICD-10-CM | POA: Insufficient documentation

## 2014-12-28 DIAGNOSIS — J45909 Unspecified asthma, uncomplicated: Secondary | ICD-10-CM | POA: Insufficient documentation

## 2014-12-28 DIAGNOSIS — K625 Hemorrhage of anus and rectum: Secondary | ICD-10-CM | POA: Insufficient documentation

## 2014-12-28 DIAGNOSIS — R109 Unspecified abdominal pain: Secondary | ICD-10-CM | POA: Insufficient documentation

## 2014-12-28 DIAGNOSIS — R112 Nausea with vomiting, unspecified: Secondary | ICD-10-CM | POA: Insufficient documentation

## 2014-12-28 LAB — CBC WITH DIFFERENTIAL/PLATELET
BASOS ABS: 0 10*3/uL (ref 0.0–0.1)
BASOS PCT: 0 % (ref 0–1)
EOS PCT: 3 % (ref 0–5)
Eosinophils Absolute: 0.3 10*3/uL (ref 0.0–0.7)
HCT: 46.9 % (ref 39.0–52.0)
Hemoglobin: 17.1 g/dL — ABNORMAL HIGH (ref 13.0–17.0)
Lymphocytes Relative: 21 % (ref 12–46)
Lymphs Abs: 1.5 10*3/uL (ref 0.7–4.0)
MCH: 33.6 pg (ref 26.0–34.0)
MCHC: 36.5 g/dL — AB (ref 30.0–36.0)
MCV: 92.1 fL (ref 78.0–100.0)
Monocytes Absolute: 0.6 10*3/uL (ref 0.1–1.0)
Monocytes Relative: 8 % (ref 3–12)
NEUTROS ABS: 5 10*3/uL (ref 1.7–7.7)
Neutrophils Relative %: 68 % (ref 43–77)
Platelets: 252 10*3/uL (ref 150–400)
RBC: 5.09 MIL/uL (ref 4.22–5.81)
RDW: 12.7 % (ref 11.5–15.5)
WBC: 7.4 10*3/uL (ref 4.0–10.5)

## 2014-12-28 LAB — COMPREHENSIVE METABOLIC PANEL
ALK PHOS: 50 U/L (ref 39–117)
ALT: 20 U/L (ref 0–53)
AST: 26 U/L (ref 0–37)
Albumin: 4.3 g/dL (ref 3.5–5.2)
Anion gap: 8 (ref 5–15)
BILIRUBIN TOTAL: 0.5 mg/dL (ref 0.3–1.2)
BUN: 16 mg/dL (ref 6–23)
CHLORIDE: 103 mmol/L (ref 96–112)
CO2: 27 mmol/L (ref 19–32)
CREATININE: 1.07 mg/dL (ref 0.50–1.35)
Calcium: 9.4 mg/dL (ref 8.4–10.5)
GFR calc Af Amer: >90 mL/min (ref >90)
Glucose, Bld: 115 mg/dL — ABNORMAL HIGH (ref 70–99)
Potassium: 3.9 mmol/L (ref 3.5–5.1)
SODIUM: 138 mmol/L (ref 135–145)
Total Protein: 7.2 g/dL (ref 6.0–8.3)

## 2014-12-28 NOTE — ED Notes (Signed)
No answer when called 

## 2014-12-28 NOTE — ED Notes (Signed)
Called for room x 1.  

## 2014-12-28 NOTE — ED Notes (Signed)
Pt was drinking coffee

## 2014-12-28 NOTE — ED Notes (Signed)
Pt reports seen for same on Saturday. Pt reports has not been able to follow up with GI at this time. Pt reports continued rectal bleeding,nausea and vomiting.

## 2014-12-28 NOTE — ED Notes (Signed)
Called for room x2. No answer. 

## 2015-01-07 ENCOUNTER — Emergency Department (HOSPITAL_COMMUNITY)
Admission: EM | Admit: 2015-01-07 | Discharge: 2015-01-07 | Disposition: A | Discharge status: Home / Self Care | Payer: Self-pay

## 2015-01-07 NOTE — ED Notes (Signed)
Called for pt x 2 but pt not in waiting room.

## 2015-01-07 NOTE — ED Notes (Signed)
Pt called x 3 for Triage, not in waiting area or bathroom.

## 2015-03-13 ENCOUNTER — Emergency Department (HOSPITAL_COMMUNITY)
Admission: EM | Admit: 2015-03-13 | Discharge: 2015-03-14 | Disposition: A | Discharge status: Other Acute Inpatient Hospital | Payer: Self-pay | Attending: Emergency Medicine | Admitting: Emergency Medicine

## 2015-03-13 ENCOUNTER — Encounter (HOSPITAL_COMMUNITY): Payer: Self-pay

## 2015-03-13 DIAGNOSIS — F1310 Sedative, hypnotic or anxiolytic abuse, uncomplicated: Secondary | ICD-10-CM | POA: Insufficient documentation

## 2015-03-13 DIAGNOSIS — F1110 Opioid abuse, uncomplicated: Secondary | ICD-10-CM | POA: Insufficient documentation

## 2015-03-13 DIAGNOSIS — Z720 Tobacco use: Secondary | ICD-10-CM | POA: Insufficient documentation

## 2015-03-13 DIAGNOSIS — Y9389 Activity, other specified: Secondary | ICD-10-CM | POA: Insufficient documentation

## 2015-03-13 DIAGNOSIS — F1210 Cannabis abuse, uncomplicated: Secondary | ICD-10-CM | POA: Insufficient documentation

## 2015-03-13 DIAGNOSIS — Y9289 Other specified places as the place of occurrence of the external cause: Secondary | ICD-10-CM | POA: Insufficient documentation

## 2015-03-13 DIAGNOSIS — S61412A Laceration without foreign body of left hand, initial encounter: Secondary | ICD-10-CM | POA: Insufficient documentation

## 2015-03-13 DIAGNOSIS — Y998 Other external cause status: Secondary | ICD-10-CM | POA: Insufficient documentation

## 2015-03-13 DIAGNOSIS — J45909 Unspecified asthma, uncomplicated: Secondary | ICD-10-CM | POA: Insufficient documentation

## 2015-03-13 DIAGNOSIS — T50902A Poisoning by unspecified drugs, medicaments and biological substances, intentional self-harm, initial encounter: Secondary | ICD-10-CM

## 2015-03-13 DIAGNOSIS — X58XXXA Exposure to other specified factors, initial encounter: Secondary | ICD-10-CM | POA: Insufficient documentation

## 2015-03-13 DIAGNOSIS — F209 Schizophrenia, unspecified: Secondary | ICD-10-CM | POA: Insufficient documentation

## 2015-03-13 DIAGNOSIS — T39312A Poisoning by propionic acid derivatives, intentional self-harm, initial encounter: Secondary | ICD-10-CM | POA: Insufficient documentation

## 2015-03-13 DIAGNOSIS — Z79899 Other long term (current) drug therapy: Secondary | ICD-10-CM | POA: Insufficient documentation

## 2015-03-13 LAB — CBC WITH DIFFERENTIAL/PLATELET
BASOS ABS: 0 10*3/uL (ref 0.0–0.1)
BASOS PCT: 0 % (ref 0–1)
Eosinophils Absolute: 0.1 10*3/uL (ref 0.0–0.7)
Eosinophils Relative: 1 % (ref 0–5)
HEMATOCRIT: 46.2 % (ref 39.0–52.0)
HEMOGLOBIN: 16.5 g/dL (ref 13.0–17.0)
LYMPHS PCT: 16 % (ref 12–46)
Lymphs Abs: 1.1 10*3/uL (ref 0.7–4.0)
MCH: 32.9 pg (ref 26.0–34.0)
MCHC: 35.7 g/dL (ref 30.0–36.0)
MCV: 92 fL (ref 78.0–100.0)
Monocytes Absolute: 0.3 10*3/uL (ref 0.1–1.0)
Monocytes Relative: 5 % (ref 3–12)
Neutro Abs: 5.4 10*3/uL (ref 1.7–7.7)
Neutrophils Relative %: 78 % — ABNORMAL HIGH (ref 43–77)
Platelets: 275 10*3/uL (ref 150–400)
RBC: 5.02 MIL/uL (ref 4.22–5.81)
RDW: 13.4 % (ref 11.5–15.5)
WBC: 6.9 10*3/uL (ref 4.0–10.5)

## 2015-03-13 LAB — URINALYSIS, ROUTINE W REFLEX MICROSCOPIC
BILIRUBIN URINE: NEGATIVE
Glucose, UA: NEGATIVE mg/dL
Hgb urine dipstick: NEGATIVE
Ketones, ur: 15 mg/dL — AB
Leukocytes, UA: NEGATIVE
Nitrite: NEGATIVE
Protein, ur: NEGATIVE mg/dL
SPECIFIC GRAVITY, URINE: 1.02 (ref 1.005–1.030)
UROBILINOGEN UA: 0.2 mg/dL (ref 0.0–1.0)
pH: 7.5 (ref 5.0–8.0)

## 2015-03-13 LAB — COMPREHENSIVE METABOLIC PANEL
ALBUMIN: 4.3 g/dL (ref 3.5–5.0)
ALT: 13 U/L — AB (ref 17–63)
ANION GAP: 10 (ref 5–15)
AST: 19 U/L (ref 15–41)
Alkaline Phosphatase: 51 U/L (ref 38–126)
BILIRUBIN TOTAL: 0.9 mg/dL (ref 0.3–1.2)
BUN: 11 mg/dL (ref 6–20)
CALCIUM: 9.6 mg/dL (ref 8.9–10.3)
CO2: 26 mmol/L (ref 22–32)
Chloride: 102 mmol/L (ref 101–111)
Creatinine, Ser: 1.03 mg/dL (ref 0.61–1.24)
GFR calc Af Amer: >60 mL/min (ref >60)
GLUCOSE: 118 mg/dL — AB (ref 65–99)
POTASSIUM: 3.8 mmol/L (ref 3.5–5.1)
Sodium: 138 mmol/L (ref 135–145)
Total Protein: 6.9 g/dL (ref 6.5–8.1)

## 2015-03-13 LAB — RAPID URINE DRUG SCREEN, HOSP PERFORMED
AMPHETAMINES: NOT DETECTED
BENZODIAZEPINES: POSITIVE — AB
Barbiturates: POSITIVE — AB
Cocaine: NOT DETECTED
OPIATES: POSITIVE — AB
Tetrahydrocannabinol: POSITIVE — AB

## 2015-03-13 LAB — SALICYLATE LEVEL: Salicylate Lvl: <4 mg/dL (ref 2.8–30.0)

## 2015-03-13 LAB — ACETAMINOPHEN LEVEL

## 2015-03-13 LAB — ETHANOL: ALCOHOL ETHYL (B): 13 mg/dL — AB (ref <5)

## 2015-03-13 MED ORDER — ACETAMINOPHEN 325 MG PO TABS: 650.0000 mg | ORAL_TABLET | ORAL | Status: DC | PRN

## 2015-03-13 MED ORDER — SODIUM CHLORIDE 0.9 % IV SOLN
1000.0000 mL | Freq: Once | INTRAVENOUS | Status: AC
  Administered 2015-03-13: 1000 mL via INTRAVENOUS

## 2015-03-13 MED ORDER — SODIUM CHLORIDE 0.9 % IV SOLN
1000.0000 mL | INTRAVENOUS | Status: DC
  Administered 2015-03-13: 1000 mL via INTRAVENOUS

## 2015-03-13 NOTE — ED Notes (Signed)
Pt here via EMS. States he took 23, 200 mg ibuprofen about an hour ago. States he has been depressed and was trying to take his life.

## 2015-03-13 NOTE — BH Assessment (Signed)
[facility] Assessment Progress Note  Pt accepted to [facility], can come after 7pm shift change when we call [employer] (there are three ahead of him coming after 7pm) [facility] T[facility] EndoKelly, [facility].

## 2015-03-13 NOTE — ED Notes (Signed)
[facility] and [facility].  Pt informed of this and agrees to go voluntarily.

## 2015-03-13 NOTE — BH Assessment (Signed)
[facility] Assessment Progress Note Spoke with ED staff, they will put machine in the room. Took history from EDP, and pt will be seen shortly.

## 2015-03-13 NOTE — ED Provider Notes (Signed)
CSN: [id number]     Arrival date & time 03/13/15  1550 History   First MD Initiated Contact with Patient 03/13/15 1600     Chief Complaint  Patient presents with  . V70.1     (Consider location/radiation/quality/duration/timing/severity/associated sxs/prior Treatment) HPI Patient presents after intentional ingestion of ibuprofen and a suicide attempt. Patient states that he has substantial stress from relationship with his girlfriend, who is pregnant. About 2 hours ago the patient 23 200 mg tablets. He denies any physical complaints currently, lightheadedness, nausea. He noted history of depression, prior thoughts of suicidal ideation, though none recently. Several days ago, during this episode of stress he self-inflicted several wounds on his left wrist, but denies any loss of sensation or function of the left hand or wrist.  Past Medical History  Diagnosis Date  . Asthma   . Hallucination, visual   . Schizophrenic disorder    Past Surgical History  Procedure Laterality Date  . No past surgeries     No family history on file. History  Substance Use Topics  . Smoking status: Current Every Day Smoker -- 1.00 packs/day for 10 years    Types: Cigarettes  . Smokeless tobacco: Not on file  . Alcohol Use: Yes     Comment: 6 pk beer weekly    Review of Systems  Constitutional:       Per HPI, otherwise negative  HENT:       Per HPI, otherwise negative  Respiratory:       Per HPI, otherwise negative  Cardiovascular:       Per HPI, otherwise negative  Gastrointestinal: Negative for vomiting.  Endocrine:       Negative aside from HPI  Genitourinary:       Neg aside from HPI   Musculoskeletal:       Per HPI, otherwise negative  Skin: Negative.   Neurological: Negative for syncope.  Psychiatric/Behavioral: Positive for suicidal ideas, self-injury and dysphoric mood.      Allergies  Review of patient's allergies indicates no known allergies.  Home Medications    Prior to Admission medications   Medication Sig Start Date End Date Taking? Authorizing Provider  albuterol (PROVENTIL HFA;VENTOLIN HFA) 108 (90 BASE) MCG/ACT inhaler Inhale 2 puffs into the lungs every 4 (four) hours as needed for wheezing or shortness of breath. 06/20/12 12/26/14  Curlene Labrum Readling, MD  gabapentin (NEURONTIN) 300 MG capsule Take 1 capsule (300 mg total) by mouth 3 (three) times daily at 8am, 2pm and bedtime. For anxiety and mood stabilization. Patient not taking: Reported on 12/26/2014 06/20/12 12/26/14  Curlene Labrum Readling, MD  loperamide (IMODIUM) 2 MG capsule Take 1 capsule (2 mg total) by mouth 4 (four) times daily as needed for diarrhea or loose stools. Patient not taking: Reported on 12/26/2014 10/21/14   Kristen N Ward, DO  OLANZapine (ZYPREXA) 5 MG tablet Take 1 tablet (5 mg total) by mouth at bedtime. For clarity of thought. Patient not taking: Reported on 10/20/2014 06/20/12 07/20/12  Curlene Labrum Readling, MD  promethazine (PHENERGAN) 25 MG tablet Take 1 tablet (25 mg total) by mouth every 6 (six) hours as needed for nausea or vomiting. 12/26/14   Gilda Crease, MD  ranitidine (ZANTAC) 150 MG tablet Take 1 tablet (150 mg total) by mouth 2 (two) times daily. 12/26/14   Gilda Crease, MD  sertraline (ZOLOFT) 50 MG tablet Take 1 tablet (50 mg total) by mouth daily. For depression and anxiety. Patient not taking: Reported on  10/20/2014 06/20/12 06/20/13  Ronny Baconandy D Readling, MD  traZODone (DESYREL) 50 MG tablet Take 1 tablet (50 mg total) by mouth at bedtime and may repeat dose one time if needed. For sleep. Patient not taking: Reported on 10/20/2014 06/20/12 01/14/14  Curlene Labrumandy D Readling, MD   BP 133/99 mmHg  Pulse 112  Temp(Src) 98.2 F (36.8 C) (Oral)  Resp 18  Ht 5\' 5"  (1.651 m)  Wt 140 lb (63.504 kg)  BMI 23.30 kg/m2  SpO2 100% Physical Exam  Constitutional: He is oriented to person, place, and time. He appears well-developed. No distress.  HENT:  Head: Normocephalic and  atraumatic.  Eyes: Conjunctivae and EOM are normal.  Cardiovascular: Normal rate and regular rhythm.   Pulmonary/Chest: Effort normal. No stridor. No respiratory distress.  Abdominal: He exhibits no distension.  Musculoskeletal: He exhibits no edema.  Neurological: He is alert and oriented to person, place, and time.  Skin: Skin is warm and dry.  On the left wrist there are 2 cigarette burn sized lesions, neither with substantial erythema. There are multiple noninfected superficial lacerations.  Psychiatric: He has a normal mood and affect. His speech is normal and behavior is normal. Cognition and memory are not impaired. He expresses suicidal ideation. He expresses suicidal plans.  Nursing note and vitals reviewed.   ED Course  Procedures (including critical care time) Labs Review Labs Reviewed  ACETAMINOPHEN LEVEL - Abnormal; Notable for the following:    Acetaminophen (Tylenol), Serum <10 (*)    All other components within normal limits  COMPREHENSIVE METABOLIC PANEL - Abnormal; Notable for the following:    Glucose, Bld 118 (*)    ALT 13 (*)    All other components within normal limits  CBC WITH DIFFERENTIAL/PLATELET - Abnormal; Notable for the following:    Neutrophils Relative % 78 (*)    All other components within normal limits  ETHANOL - Abnormal; Notable for the following:    Alcohol, Ethyl (B) 13 (*)    All other components within normal limits  SALICYLATE LEVEL  URINE RAPID DRUG SCREEN (HOSP PERFORMED) NOT AT [facility]  URINALYSIS, ROUTINE W REFLEX MICROSCOPIC ([facility])    LD50 of ibuprofen is ~600mg  / kg.  Ingestion was 4600mg  or  65 mg/ kg.  IVF running.   MDM   Patient with a history of depression presents after intentional ingestion of ibuprofen. No evidence for endorgan damage, nor substantial toxicity after hours of monitoring here. Patient was medically cleared for psychiatric evaluation.   Gerhard Munchobert Tashiba Timoney, MD 03/13/15 [phone]

## 2015-03-13 NOTE — ED Notes (Signed)
Pt states that he only took pills as a gesture to scare people but that he knew Ibuprofen would not kill him.  States that he took 9323 of them.  States that he is not actively thinking of harming self but that he does not want to say too much because he knows that he will be admitted somewhere.

## 2015-03-13 NOTE — Progress Notes (Signed)
[facility]  TTS Counselor called mother to clarify criminal record. Mother states several years ago patient got arrested for breaking and entering and was in the car "with the wrong crowd". Rosey BathKelly Lotus Santillo, RN

## 2015-03-13 NOTE — BH Assessment (Addendum)
Tele Assessment Note   Louis Scott is an 29 y.o. male who came to [facility] after overdosing on 23 Ibuprofen pills. Pt states that he was not suicidal, but was only trying to get attention. He states that his GF is 4 months pregnant, and left two weeks ago, he cannot get in touch with her to figure out if he needs to go to court to see the baby when it is born, and he wanted his mom to call her and talk to her about it. When she was not listening, he says he took the pills to get her attention. He also says he cut his arm within the past week due to the stress he has been under.  He denies current SI, HI, AVH. Pt has a history of schizophrenia, for which he was admitted to [facility] about 7 years ago for 2 days. He says he was in the hospital at that time for 2 days and was then d/c and went to [facility] for Op treatment. Pt denies any current providers and is not on any medications.  He lives with his mom sometimes and his GF since his GF left. Pt says he does not work, and is anxious about how he will provide for his kids (he also has two other children).  He says that his mom and his GM are his family supports.  He denies family history of SA or mental health issues.  During assessment, pt appeared anxious with depressed mood, and his affect was congruent with mood. He was clam and cooperative, with good eye contact, normal movement, typical speech and thought content, and casual appearance. There was no evidence pt was responding to internal stimuli at this time. Pt denies SA, but said he had "a couple of beers" yesterday.  Collateral information from [employer], pt's mom: She states that she saw pt take pills and that he stated that he was going to end it all. She states that she thinks he might be bipolar because nothing can calm him down when he gets angry. She also states that he is a "nervous person", he is untrustworthy and he can't get a job because he "has a record". She also states that pt is funny  and will "make you laugh". She states that he could not afford his medication before when he had previous treatment, and that he stopped going to [facility] because one person there told him there was "nothing wrong with him".  Claudette Head, DNP recommends Ip treatment for further evaluation and stabilization. TTS will seek placement. Pt states that he does not want to go IP, but will sign voluntary paperwork if that is what the doctor recommends.  Axis I: Mood Disorder NOS Axis II: Deferred Axis III:  Past Medical History  Diagnosis Date  . Asthma   . Hallucination, visual   . Schizophrenic disorder    Axis IV: economic problems, housing problems and other psychosocial or environmental problems Axis V: 41-50 serious symptoms  Past Medical History:  Past Medical History  Diagnosis Date  . Asthma   . Hallucination, visual   . Schizophrenic disorder     Past Surgical History  Procedure Laterality Date  . No past surgeries      Family History: No family history on file.  Social History:  reports that he has been smoking Cigarettes.  He has a 10 pack-year smoking history. He does not have any smokeless tobacco history on file. He reports that he drinks alcohol.  He reports that he uses illicit drugs (Marijuana).  Additional Social History:  Alcohol / Drug Use Pain Medications: denies Prescriptions: denies Over the Counter: denies History of alcohol / drug use?: No history of alcohol / drug abuse Longest period of sobriety (when/how long):  (denies) Negative Consequences of Use:  (denies)  CIWA: CIWA-Ar BP: 133/99 mmHg Pulse Rate: 112 COWS:    PATIENT STRENGTHS: (choose at least two) Ability for insight Average or above average intelligence Capable of independent living Communication skills Supportive family/friends  Allergies: No Known Allergies  Home Medications:  (Not in a hospital admission)  OB/GYN Status:  No LMP for male patient.  General Assessment  Data Location of Assessment: [facility] TTS Assessment: In system Is this a Tele or Face-to-Face Assessment?: Tele Assessment Is this an Initial Assessment or a Re-assessment for this encounter?: Initial Assessment Marital status: Single Is patient pregnant?: No Pregnancy Status: No Living Arrangements: Other relatives, Parent (GF and mom) Can pt return to current living arrangement?: Yes Admission Status: Voluntary Is patient capable of signing voluntary admission?: Yes Referral Source: Self/Family/Friend Insurance type: SP     Crisis Care Plan Living Arrangements: Other relatives, Parent (GF and mom) Name of Psychiatrist: none Name of Therapist:  (None)  Education Status Is patient currently in school?: No Highest grade of school patient has completed: 10  Risk to self with the past 6 months Suicidal Ideation: No Has patient been a risk to self within the past 6 months prior to admission? : No Suicidal Intent: No Has patient had any suicidal intent within the past 6 months prior to admission? : No Is patient at risk for suicide?: Yes Suicidal Plan?: No Has patient had any suicidal plan within the past 6 months prior to admission? : No Access to Means: No What has been your use of drugs/alcohol within the last 12 months?: 3 beers yesterday Previous Attempts/Gestures: No Other Self Harm Risks:  (none) Intentional Self Injurious Behavior: Cutting (cut arm the other day for attention) Comment - Self Injurious Behavior:  (see above) Family Suicide History: No Recent stressful life event(s): Conflict (Comment), Financial Problems (with GF, who is pregnant) Persecutory voices/beliefs?: No Depression: Yes Depression Symptoms: Insomnia, Tearfulness, Despondent, Fatigue, Guilt, Loss of interest in usual pleasures, Feeling worthless/self pity, Feeling angry/irritable Substance abuse history and/or treatment for substance abuse?: Yes Suicide prevention information given to non-admitted  patients: Yes  Risk to Others within the past 6 months Homicidal Ideation: No Does patient have any lifetime risk of violence toward others beyond the six months prior to admission? : No Thoughts of Harm to Others: No Current Homicidal Intent: No Current Homicidal Plan: No Access to Homicidal Means: No History of harm to others?: No Assessment of Violence: None Noted Does patient have access to weapons?: No Criminal Charges Pending?: No Does patient have a court date: No Is patient on probation?: No  Psychosis Hallucinations: None noted Delusions: None noted  Mental Status Report Appearance/Hygiene: In scrubs, Unremarkable Eye Contact: Good Motor Activity: Restlessness Speech: Logical/coherent Level of Consciousness: Alert Mood: Depressed, Anxious Affect: Anxious, Depressed Anxiety Level: Panic Attacks Panic attack frequency: daily Most recent panic attack: today Thought Processes: Coherent, Relevant Judgement: Impaired Orientation: Person, Place, Time, Situation, Appropriate for developmental age Obsessive Compulsive Thoughts/Behaviors: Moderate  Cognitive Functioning Concentration: Decreased Memory: Recent Intact, Remote Intact IQ: Average Insight: Poor Impulse Control: Poor Appetite: Poor Weight Loss: 0 Weight Gain: 0 Sleep: Decreased Total Hours of Sleep: 3 Vegetative Symptoms: None  ADLScreening [facility] Assessment  Services) Patient's cognitive ability adequate to safely complete daily activities?: Yes Patient able to express need for assistance with ADLs?: Yes Independently performs ADLs?: Yes (appropriate for developmental age)  Prior Inpatient Therapy Prior Inpatient Therapy: Yes Prior Therapy Dates: 7? yrs ago Prior Therapy Facilty/Provider(s): [facility] Reason for Treatment: schizophrenia  Prior Outpatient Therapy Prior Outpatient Therapy: Yes Prior Therapy Dates: several years ago Prior Therapy Facilty/Provider(s): [facility] Reason for Treatment:  schizophrenia Does patient have an ACCT team?: No Does patient have Intensive In-House Services?  : No Does patient have [facility] services? : No Does patient have P4CC services?: No  ADL Screening (condition at time of admission) Patient's cognitive ability adequate to safely complete daily activities?: Yes Is the patient deaf or have difficulty hearing?: No Does the patient have difficulty seeing, even when wearing glasses/contacts?: No Does the patient have difficulty concentrating, remembering, or making decisions?: No Patient able to express need for assistance with ADLs?: Yes Does the patient have difficulty dressing or bathing?: No Independently performs ADLs?: Yes (appropriate for developmental age) Does the patient have difficulty walking or climbing stairs?: No Weakness of Legs: None Weakness of Arms/Hands: None  Home Assistive Devices/Equipment Home Assistive Devices/Equipment: None    Abuse/Neglect Assessment (Assessment to be complete while patient is alone) Physical Abuse: Denies Verbal Abuse: Denies Sexual Abuse: Denies Exploitation of patient/patient's resources: Denies Self-Neglect: Denies Values / Beliefs Cultural Requests During Hospitalization: None Spiritual Requests During Hospitalization: None   Advance Directives (For Healthcare) Does patient have an advance directive?: No Would patient like information on creating an advanced directive?: No - patient declined information    Additional Information 1:1 In Past 12 Months?: No CIRT Risk: No Elopement Risk: No Does patient have medical clearance?: Yes     Disposition:  Disposition Initial Assessment Completed for this Encounter: Yes Disposition of Patient: Other dispositions (review with psychaitry) Other disposition(s):  (review with psychiatry)  [facility] 03/13/2015 5:06 PM

## 2015-03-14 ENCOUNTER — Encounter (HOSPITAL_COMMUNITY): Payer: Self-pay | Admitting: *Deleted

## 2015-03-14 ENCOUNTER — Inpatient Hospital Stay (HOSPITAL_COMMUNITY)
Admission: AD | Admit: 2015-03-14 | Discharge: 2015-03-16 | DRG: 885 | Disposition: A | Discharge status: Home / Self Care | Payer: 59 | Source: Intra-hospital | Attending: Psychiatry | Admitting: Psychiatry

## 2015-03-14 DIAGNOSIS — T50902A Poisoning by unspecified drugs, medicaments and biological substances, intentional self-harm, initial encounter: Secondary | ICD-10-CM | POA: Diagnosis present

## 2015-03-14 DIAGNOSIS — F1120 Opioid dependence, uncomplicated: Secondary | ICD-10-CM | POA: Diagnosis present

## 2015-03-14 DIAGNOSIS — F203 Undifferentiated schizophrenia: Principal | ICD-10-CM | POA: Diagnosis present

## 2015-03-14 DIAGNOSIS — F1994 Other psychoactive substance use, unspecified with psychoactive substance-induced mood disorder: Secondary | ICD-10-CM | POA: Diagnosis present

## 2015-03-14 DIAGNOSIS — T39312A Poisoning by propionic acid derivatives, intentional self-harm, initial encounter: Secondary | ICD-10-CM | POA: Diagnosis not present

## 2015-03-14 DIAGNOSIS — F419 Anxiety disorder, unspecified: Secondary | ICD-10-CM | POA: Diagnosis present

## 2015-03-14 DIAGNOSIS — F1210 Cannabis abuse, uncomplicated: Secondary | ICD-10-CM | POA: Diagnosis present

## 2015-03-14 DIAGNOSIS — F1910 Other psychoactive substance abuse, uncomplicated: Secondary | ICD-10-CM

## 2015-03-14 DIAGNOSIS — F17210 Nicotine dependence, cigarettes, uncomplicated: Secondary | ICD-10-CM | POA: Diagnosis present

## 2015-03-14 DIAGNOSIS — G4700 Insomnia, unspecified: Secondary | ICD-10-CM | POA: Diagnosis present

## 2015-03-14 DIAGNOSIS — J45909 Unspecified asthma, uncomplicated: Secondary | ICD-10-CM | POA: Diagnosis present

## 2015-03-14 DIAGNOSIS — F1920 Other psychoactive substance dependence, uncomplicated: Secondary | ICD-10-CM

## 2015-03-14 DIAGNOSIS — F329 Major depressive disorder, single episode, unspecified: Secondary | ICD-10-CM | POA: Diagnosis not present

## 2015-03-14 DIAGNOSIS — Z23 Encounter for immunization: Secondary | ICD-10-CM

## 2015-03-14 DIAGNOSIS — R45851 Suicidal ideations: Secondary | ICD-10-CM | POA: Diagnosis present

## 2015-03-14 DIAGNOSIS — X58XXXA Exposure to other specified factors, initial encounter: Secondary | ICD-10-CM | POA: Diagnosis not present

## 2015-03-14 DIAGNOSIS — K219 Gastro-esophageal reflux disease without esophagitis: Secondary | ICD-10-CM | POA: Diagnosis present

## 2015-03-14 DIAGNOSIS — T1491 Suicide attempt: Secondary | ICD-10-CM | POA: Diagnosis not present

## 2015-03-14 MED ORDER — PNEUMOCOCCAL VAC POLYVALENT 25 MCG/0.5ML IJ INJ: 0.5000 mL | INJECTION | INTRAMUSCULAR | Status: DC

## 2015-03-14 MED ORDER — FLUOXETINE HCL 10 MG PO CAPS
10.0000 mg | ORAL_CAPSULE | Freq: Every day | ORAL | Status: DC
  Administered 2015-03-14 – 2015-03-16 (×3): 10 mg via ORAL
  Filled 2015-03-14 (×5): qty 1
  Filled 2015-03-14: qty 14
  Filled 2015-03-14: qty 1

## 2015-03-14 MED ORDER — ALBUTEROL SULFATE HFA 108 (90 BASE) MCG/ACT IN AERS
2.0000 | INHALATION_SPRAY | RESPIRATORY_TRACT | Status: DC | PRN
  Administered 2015-03-14 – 2015-03-16 (×8): 2 via RESPIRATORY_TRACT
  Filled 2015-03-14: qty 6.7

## 2015-03-14 MED ORDER — ALBUTEROL SULFATE HFA 108 (90 BASE) MCG/ACT IN AERS: 2.0000 | INHALATION_SPRAY | RESPIRATORY_TRACT | Status: DC | PRN

## 2015-03-14 MED ORDER — HYDROXYZINE HCL 50 MG PO TABS
50.0000 mg | ORAL_TABLET | Freq: Every evening | ORAL | Status: DC | PRN
  Administered 2015-03-14 – 2015-03-15 (×3): 50 mg via ORAL
  Filled 2015-03-14 (×3): qty 1
  Filled 2015-03-14: qty 14

## 2015-03-14 MED ORDER — NICOTINE POLACRILEX 2 MG MT GUM
2.0000 mg | CHEWING_GUM | OROMUCOSAL | Status: DC | PRN
  Administered 2015-03-14 – 2015-03-16 (×7): 2 mg via ORAL
  Filled 2015-03-14 (×7): qty 1

## 2015-03-14 NOTE — Tx Team (Signed)
Initial Interdisciplinary Treatment Plan   PATIENT STRESSORS: Financial difficulties Marital or family conflict   PATIENT STRENGTHS: Ability for insight Average or above average intelligence Capable of independent living General fund of knowledge Supportive family/friends   PROBLEM LIST: Problem List/Patient Goals Date to be addressed Date deferred Reason deferred Estimated date of resolution  " I would like to work on my anxiety and depression 03/14/15     Depression 03/14/15     Suicidal Ideation 03/14/15                                          DISCHARGE CRITERIA:  Ability to meet basic life and health needs Improved stabilization in mood, thinking, and/or behavior Verbal commitment to aftercare and medication compliance  PRELIMINARY DISCHARGE PLAN: Attend aftercare/continuing care group Return to previous living arrangement  PATIENT/FAMIILY INVOLVEMENT: This treatment plan has been presented to and reviewed with the patient, Marrion CoyJustin L Menees, and/or family member, .  The patient and family have been given the opportunity to ask questions and make suggestions.  Lyda Colcord, [place] 03/14/2015, 1:43 AM

## 2015-03-14 NOTE — Progress Notes (Signed)
[facility] Group Notes:  (Nursing/MHT/Case Management/Adjunct)  Date:  03/14/2015  Time:  9:26 PM  Type of Therapy:  Psychoeducational Skills  Participation Level:  Active  Participation Quality:  Appropriate  Affect:  Appropriate  Cognitive:  Appropriate  Insight:  Appropriate  Engagement in Group:  Engaged  Modes of Intervention:  Discussion  Summary of Progress/Problems: Tonight in wrap up group Louis Scott said that he slept for majority of the day as week and that his children are what keep him going but that his family is his support system and why he wants to get better.  Louis Scott 03/14/2015, 9:26 PM

## 2015-03-14 NOTE — H&P (Signed)
Psychiatric Admission Assessment Adult  Patient Identification: ABDOUL ENCINAS MRN:  [id number] Date of Evaluation:  03/14/2015 Chief Complaint:  BIPOLAR Principal Diagnosis: Drug overdose, intentional Diagnosis:   Patient Active Problem List   Diagnosis Date Noted  . 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 Cannabis abuse [F12.10] 06/18/2012   History of Present Illness: Warnie Belair is a 29 years old single Caucasian male admitted to [facility] from [facility] emergency department emergently involuntarily for status post intentional overdose of medication with intention to end his life. Patient reported he called his mother after overdose medication and also has a history of self-injurious behaviors and multiple superficial scars on his left forearm. Patient has a history of previous suicidal attempt. Patient reported he has relationship problems with his girlfriend who left him 2 weeks ago underwent his mother's home. She is pregnant with 8 months gestation and patient worried about ongoing baby. Patient has a 2 other children who are with their mothers. Patient also has a substance abuse and the urine drug screen is positive for tetrahydrocannabinol, Pamala Hurry a date's, benzodiazepine and opiates. Patient denied treatment for substance abuse.  Elements:  Location:  Substance abuse and depression. Quality:  Poor. Severity:  Status post suicidal attempt and self-injurious behavior. Timing:  Separation from the girlfriend who is pregnant. Duration:  One week. Context:  Multiple psychosocial stresses. Associated Signs/Symptoms: Depression Symptoms:  depressed mood, anhedonia, insomnia, psychomotor retardation, fatigue, feelings of worthlessness/guilt, difficulty concentrating, hopelessness, suicidal attempt, anxiety, disturbed sleep, weight loss, decreased labido, decreased appetite, (Hypo) Manic Symptoms:   Distractibility, Impulsivity, Irritable Mood, Labiality of Mood, Anxiety Symptoms:  Excessive Worry, Psychotic Symptoms:  Denied PTSD Symptoms: NA Total Time spent with patient: 1 hour  Past Medical History:  Past Medical History  Diagnosis Date  . Asthma   . Hallucination, visual   . Schizophrenic disorder     Past Surgical History  Procedure Laterality Date  . No past surgeries     Family History: History reviewed. No pertinent family history. Social History:  History  Alcohol Use  . Yes    Comment: 6 pk beer weekly     History  Drug Use  . Yes  . Special: Marijuana    Comment: THC last use 3 weeks ago    History   Social History  . Marital Status: Single    Spouse Name: N/A  . Number of Children: N/A  . Years of Education: N/A   Social History Main Topics  . Smoking status: Current Every Day Smoker -- 1.00 packs/day for 10 years    Types: Cigarettes  . Smokeless tobacco: Not on file  . Alcohol Use: Yes     Comment: 6 pk beer weekly  . Drug Use: Yes    Special: Marijuana     Comment: THC last use 3 weeks ago  . Sexual Activity: Yes    Birth Control/ Protection: None   Other Topics Concern  . None   Social History Narrative   Additional Social History:                          Musculoskeletal: Strength & Muscle Tone: within normal limits Gait & Station: normal Patient leans: N/A  Psychiatric Specialty Exam: Physical Exam  ROS patient denied nausea, vomiting, diarrhea, constipation, skin infection, bleeding difficulties, shortness of breath, palpitations and dizziness No Fever-chills, No Headache, No changes with Vision or hearing, reports vertigo No problems swallowing food or  Liquids, No Chest pain, Cough or Shortness of Breath, No Abdominal pain, No Nausea or Vommitting, Bowel movements are regular, No Blood in stool or Urine, No dysuria, No new skin rashes or bruises, No new joints pains-aches,  No new weakness, tingling,  numbness in any extremity, No recent weight gain or loss, No polyuria, polydypsia or polyphagia,   A full 10 point Review of Systems was done, except as stated above, all other Review of Systems were negative.  Blood pressure 128/73, pulse 91, temperature 97.7 F (36.5 C), temperature source Oral, resp. rate 18, height '5\' 5"'  (1.651 m), weight 63.504 kg (140 lb).Body mass index is 23.3 kg/(m^2).  General Appearance: Guarded  Eye Contact::  Good  Speech:  Clear and Coherent  Volume:  Decreased  Mood:  Anxious, Depressed, Hopeless, Irritable and Worthless  Affect:  Constricted and Depressed  Thought Process:  Coherent and Goal Directed  Orientation:  Full (Time, Place, and Person)  Thought Content:  Rumination  Suicidal Thoughts:  Yes.  with intent/plan  Homicidal Thoughts:  No  Memory:  Immediate;   Fair Recent;   Fair  Judgement:  Impaired  Insight:  Lacking  Psychomotor Activity:  Decreased  Concentration:  Fair  Recall:  [employer] of Knowledge:Good  Language: Good  Akathisia:  Negative  Handed:  Right  AIMS (if indicated):     Assets:  Communication Skills Desire for Improvement Financial Resources/Insurance Housing Leisure Time Physical Health Resilience Social Support Talents/Skills Transportation Vocational/Educational  ADL's:  Intact  Cognition: WNL  Sleep:      Risk to Self: Is patient at risk for suicide?: Yes Risk to Others:   Prior Inpatient Therapy:   Prior Outpatient Therapy:    Alcohol Screening: 1. How often do you have a drink containing alcohol?: 2 to 4 times a month 2. How many drinks containing alcohol do you have on a typical day when you are drinking?: 5 or 6 3. How often do you have six or more drinks on one occasion?: Less than monthly Preliminary Score: 3 4. How often during the last year have you found that you were not able to stop drinking once you had started?: Never 5. How often during the last year have you failed to do what was  normally expected from you becasue of drinking?: Never 6. How often during the last year have you needed a first drink in the morning to get yourself going after a heavy drinking session?: Never 7. How often during the last year have you had a feeling of guilt of remorse after drinking?: Never 8. How often during the last year have you been unable to remember what happened the night before because you had been drinking?: Never 9. Have you or someone else been injured as a result of your drinking?: No 10. Has a relative or friend or a doctor or another health worker been concerned about your drinking or suggested you cut down?: No Alcohol Use Disorder Identification Test Final Score (AUDIT): 5 Brief Intervention: AUDIT score less than 7 or less-screening does not suggest unhealthy drinking-brief intervention not indicated  Allergies:  No Known Allergies Lab Results:  Results for orders placed or performed during the hospital encounter of 03/13/15 (from the past 48 hour(s))  Acetaminophen level     Status: Abnormal   Collection Time: 03/13/15  4:23 PM  Result Value Ref Range   Acetaminophen (Tylenol), Serum <10 (L) 10 - 30 ug/mL    Comment:  THERAPEUTIC CONCENTRATIONS VARY SIGNIFICANTLY. A RANGE OF 10-30 ug/mL MAY BE AN EFFECTIVE CONCENTRATION FOR MANY PATIENTS. HOWEVER, SOME ARE BEST TREATED AT CONCENTRATIONS OUTSIDE THIS RANGE. ACETAMINOPHEN CONCENTRATIONS >150 ug/mL AT 4 HOURS AFTER INGESTION AND >50 ug/mL AT 12 HOURS AFTER INGESTION ARE OFTEN ASSOCIATED WITH TOXIC REACTIONS.   Comprehensive metabolic panel     Status: Abnormal   Collection Time: 03/13/15  4:23 PM  Result Value Ref Range   Sodium 138 135 - 145 mmol/L   Potassium 3.8 3.5 - 5.1 mmol/L   Chloride 102 101 - 111 mmol/L   CO2 26 22 - 32 mmol/L   Glucose, Bld 118 (H) 65 - 99 mg/dL   BUN 11 6 - 20 mg/dL   Creatinine, Ser 1.03 0.61 - 1.24 mg/dL   Calcium 9.6 8.9 - 10.3 mg/dL   Total Protein 6.9 6.5 - 8.1 g/dL    Albumin 4.3 3.5 - 5.0 g/dL   AST 19 15 - 41 U/L   ALT 13 (L) 17 - 63 U/L   Alkaline Phosphatase 51 38 - 126 U/L   Total Bilirubin 0.9 0.3 - 1.2 mg/dL   GFR calc non Af Amer >60 >60 mL/min   GFR calc Af Amer >60 >60 mL/min    Comment: (NOTE) The eGFR has been calculated using the CKD EPI equation. This calculation has not been validated in all clinical situations. eGFR's persistently <60 mL/min signify possible Chronic Kidney Disease.    Anion gap 10 5 - 15  CBC WITH DIFFERENTIAL     Status: Abnormal   Collection Time: 03/13/15  4:23 PM  Result Value Ref Range   WBC 6.9 4.0 - 10.5 K/uL   RBC 5.02 4.22 - 5.81 MIL/uL   Hemoglobin 16.5 13.0 - 17.0 g/dL   HCT 46.2 39.0 - 52.0 %   MCV 92.0 78.0 - 100.0 fL   MCH 32.9 26.0 - 34.0 pg   MCHC 35.7 30.0 - 36.0 g/dL   RDW 13.4 11.5 - 15.5 %   Platelets 275 150 - 400 K/uL   Neutrophils Relative % 78 (H) 43 - 77 %   Neutro Abs 5.4 1.7 - 7.7 K/uL   Lymphocytes Relative 16 12 - 46 %   Lymphs Abs 1.1 0.7 - 4.0 K/uL   Monocytes Relative 5 3 - 12 %   Monocytes Absolute 0.3 0.1 - 1.0 K/uL   Eosinophils Relative 1 0 - 5 %   Eosinophils Absolute 0.1 0.0 - 0.7 K/uL   Basophils Relative 0 0 - 1 %   Basophils Absolute 0.0 0.0 - 0.1 K/uL  Ethanol     Status: Abnormal   Collection Time: 03/13/15  4:23 PM  Result Value Ref Range   Alcohol, Ethyl (B) 13 (H) <5 mg/dL    Comment:        LOWEST DETECTABLE LIMIT FOR SERUM ALCOHOL IS 11 mg/dL FOR MEDICAL PURPOSES ONLY   Salicylate level     Status: None   Collection Time: 03/13/15  4:23 PM  Result Value Ref Range   Salicylate Lvl <2.8 2.8 - 30.0 mg/dL  Drug screen panel, emergency     Status: Abnormal   Collection Time: 03/13/15  8:13 PM  Result Value Ref Range   Opiates POSITIVE (A) NONE DETECTED   Cocaine NONE DETECTED NONE DETECTED   Benzodiazepines POSITIVE (A) NONE DETECTED   Amphetamines NONE DETECTED NONE DETECTED   Tetrahydrocannabinol POSITIVE (A) NONE DETECTED   Barbiturates  POSITIVE (A) NONE DETECTED    Comment:  DRUG SCREEN FOR MEDICAL PURPOSES ONLY.  IF CONFIRMATION IS NEEDED FOR ANY PURPOSE, NOTIFY LAB WITHIN 5 DAYS.        LOWEST DETECTABLE LIMITS FOR URINE DRUG SCREEN Drug Class       Cutoff (ng/mL) Amphetamine      1000 Barbiturate      200 Benzodiazepine   010 Tricyclics       071 Opiates          300 Cocaine          300 THC              50   Urinalysis, Routine w reflex microscopic     Status: Abnormal   Collection Time: 03/13/15  8:13 PM  Result Value Ref Range   Color, Urine YELLOW YELLOW   APPearance CLEAR CLEAR   Specific Gravity, Urine 1.020 1.005 - 1.030   pH 7.5 5.0 - 8.0   Glucose, UA NEGATIVE NEGATIVE mg/dL   Hgb urine dipstick NEGATIVE NEGATIVE   Bilirubin Urine NEGATIVE NEGATIVE   Ketones, ur 15 (A) NEGATIVE mg/dL   Protein, ur NEGATIVE NEGATIVE mg/dL   Urobilinogen, UA 0.2 0.0 - 1.0 mg/dL   Nitrite NEGATIVE NEGATIVE   Leukocytes, UA NEGATIVE NEGATIVE    Comment: MICROSCOPIC NOT DONE ON URINES WITH NEGATIVE PROTEIN, BLOOD, LEUKOCYTES, NITRITE, OR GLUCOSE <1000 mg/dL.   Current Medications: Current Facility-Administered Medications  Medication Dose Route Frequency Provider Last Rate Last Dose  . albuterol (PROVENTIL HFA;VENTOLIN HFA) 108 (90 BASE) MCG/ACT inhaler 2 puff  2 puff Inhalation Q4H PRN Dara Hoyer, PA-C   2 puff at 03/14/15 0146  . FLUoxetine (PROZAC) capsule 10 mg  10 mg Oral Daily Ambrose Finland, MD      . hydrOXYzine (ATARAX/VISTARIL) tablet 50 mg  50 mg Oral QHS PRN Ambrose Finland, MD      . nicotine polacrilex (NICORETTE) gum 2 mg  2 mg Oral PRN Jenne Campus, MD      . Derrill Memo ON 03/15/2015] pneumococcal 23 valent vaccine (PNU-IMMUNE) injection 0.5 mL  0.5 mL Intramuscular Tomorrow-1000 Jenne Campus, MD       PTA Medications: Prescriptions prior to admission  Medication Sig Dispense Refill Last Dose  . albuterol (PROVENTIL HFA;VENTOLIN HFA) 108 (90 BASE) MCG/ACT inhaler  Inhale 2 puffs into the lungs every 4 (four) hours as needed for wheezing or shortness of breath. 18 g 0 03/13/2015 at Unknown time  . gabapentin (NEURONTIN) 300 MG capsule Take 1 capsule (300 mg total) by mouth 3 (three) times daily at 8am, 2pm and bedtime. For anxiety and mood stabilization. (Patient not taking: Reported on 12/26/2014) 90 capsule 0   . loperamide (IMODIUM) 2 MG capsule Take 1 capsule (2 mg total) by mouth 4 (four) times daily as needed for diarrhea or loose stools. (Patient not taking: Reported on 12/26/2014) 20 capsule 0   . OLANZapine (ZYPREXA) 5 MG tablet Take 1 tablet (5 mg total) by mouth at bedtime. For clarity of thought. (Patient not taking: Reported on 10/20/2014) 30 tablet 0   . promethazine (PHENERGAN) 25 MG tablet Take 1 tablet (25 mg total) by mouth every 6 (six) hours as needed for nausea or vomiting. (Patient not taking: Reported on 03/13/2015) 30 tablet 0   . ranitidine (ZANTAC) 150 MG tablet Take 1 tablet (150 mg total) by mouth 2 (two) times daily. (Patient not taking: Reported on 03/13/2015) 60 tablet 0   . sertraline (ZOLOFT) 50 MG tablet Take 1 tablet (50 mg  total) by mouth daily. For depression and anxiety. (Patient not taking: Reported on 10/20/2014) 30 tablet 0   . traZODone (DESYREL) 50 MG tablet Take 1 tablet (50 mg total) by mouth at bedtime and may repeat dose one time if needed. For sleep. (Patient not taking: Reported on 10/20/2014) 60 tablet 0     Previous Psychotropic Medications: No   Substance Abuse History in the last 12 months:  Yes.      Consequences of Substance Abuse: NA  Results for orders placed or performed during the hospital encounter of 03/13/15 (from the past 72 hour(s))  Acetaminophen level     Status: Abnormal   Collection Time: 03/13/15  4:23 PM  Result Value Ref Range   Acetaminophen (Tylenol), Serum <10 (L) 10 - 30 ug/mL    Comment:        THERAPEUTIC CONCENTRATIONS VARY SIGNIFICANTLY. A RANGE OF 10-30 ug/mL MAY BE AN  EFFECTIVE CONCENTRATION FOR MANY PATIENTS. HOWEVER, SOME ARE BEST TREATED AT CONCENTRATIONS OUTSIDE THIS RANGE. ACETAMINOPHEN CONCENTRATIONS >150 ug/mL AT 4 HOURS AFTER INGESTION AND >50 ug/mL AT 12 HOURS AFTER INGESTION ARE OFTEN ASSOCIATED WITH TOXIC REACTIONS.   Comprehensive metabolic panel     Status: Abnormal   Collection Time: 03/13/15  4:23 PM  Result Value Ref Range   Sodium 138 135 - 145 mmol/L   Potassium 3.8 3.5 - 5.1 mmol/L   Chloride 102 101 - 111 mmol/L   CO2 26 22 - 32 mmol/L   Glucose, Bld 118 (H) 65 - 99 mg/dL   BUN 11 6 - 20 mg/dL   Creatinine, Ser 1.03 0.61 - 1.24 mg/dL   Calcium 9.6 8.9 - 10.3 mg/dL   Total Protein 6.9 6.5 - 8.1 g/dL   Albumin 4.3 3.5 - 5.0 g/dL   AST 19 15 - 41 U/L   ALT 13 (L) 17 - 63 U/L   Alkaline Phosphatase 51 38 - 126 U/L   Total Bilirubin 0.9 0.3 - 1.2 mg/dL   GFR calc non Af Amer >60 >60 mL/min   GFR calc Af Amer >60 >60 mL/min    Comment: (NOTE) The eGFR has been calculated using the CKD EPI equation. This calculation has not been validated in all clinical situations. eGFR's persistently <60 mL/min signify possible Chronic Kidney Disease.    Anion gap 10 5 - 15  CBC WITH DIFFERENTIAL     Status: Abnormal   Collection Time: 03/13/15  4:23 PM  Result Value Ref Range   WBC 6.9 4.0 - 10.5 K/uL   RBC 5.02 4.22 - 5.81 MIL/uL   Hemoglobin 16.5 13.0 - 17.0 g/dL   HCT 46.2 39.0 - 52.0 %   MCV 92.0 78.0 - 100.0 fL   MCH 32.9 26.0 - 34.0 pg   MCHC 35.7 30.0 - 36.0 g/dL   RDW 13.4 11.5 - 15.5 %   Platelets 275 150 - 400 K/uL   Neutrophils Relative % 78 (H) 43 - 77 %   Neutro Abs 5.4 1.7 - 7.7 K/uL   Lymphocytes Relative 16 12 - 46 %   Lymphs Abs 1.1 0.7 - 4.0 K/uL   Monocytes Relative 5 3 - 12 %   Monocytes Absolute 0.3 0.1 - 1.0 K/uL   Eosinophils Relative 1 0 - 5 %   Eosinophils Absolute 0.1 0.0 - 0.7 K/uL   Basophils Relative 0 0 - 1 %   Basophils Absolute 0.0 0.0 - 0.1 K/uL  Ethanol     Status: Abnormal   Collection  Time: 03/13/15  4:23 PM  Result Value Ref Range   Alcohol, Ethyl (B) 13 (H) <5 mg/dL    Comment:        LOWEST DETECTABLE LIMIT FOR SERUM ALCOHOL IS 11 mg/dL FOR MEDICAL PURPOSES ONLY   Salicylate level     Status: None   Collection Time: 03/13/15  4:23 PM  Result Value Ref Range   Salicylate Lvl <5.6 2.8 - 30.0 mg/dL  Drug screen panel, emergency     Status: Abnormal   Collection Time: 03/13/15  8:13 PM  Result Value Ref Range   Opiates POSITIVE (A) NONE DETECTED   Cocaine NONE DETECTED NONE DETECTED   Benzodiazepines POSITIVE (A) NONE DETECTED   Amphetamines NONE DETECTED NONE DETECTED   Tetrahydrocannabinol POSITIVE (A) NONE DETECTED   Barbiturates POSITIVE (A) NONE DETECTED    Comment:        DRUG SCREEN FOR MEDICAL PURPOSES ONLY.  IF CONFIRMATION IS NEEDED FOR ANY PURPOSE, NOTIFY LAB WITHIN 5 DAYS.        LOWEST DETECTABLE LIMITS FOR URINE DRUG SCREEN Drug Class       Cutoff (ng/mL) Amphetamine      1000 Barbiturate      200 Benzodiazepine   861 Tricyclics       683 Opiates          300 Cocaine          300 THC              50   Urinalysis, Routine w reflex microscopic     Status: Abnormal   Collection Time: 03/13/15  8:13 PM  Result Value Ref Range   Color, Urine YELLOW YELLOW   APPearance CLEAR CLEAR   Specific Gravity, Urine 1.020 1.005 - 1.030   pH 7.5 5.0 - 8.0   Glucose, UA NEGATIVE NEGATIVE mg/dL   Hgb urine dipstick NEGATIVE NEGATIVE   Bilirubin Urine NEGATIVE NEGATIVE   Ketones, ur 15 (A) NEGATIVE mg/dL   Protein, ur NEGATIVE NEGATIVE mg/dL   Urobilinogen, UA 0.2 0.0 - 1.0 mg/dL   Nitrite NEGATIVE NEGATIVE   Leukocytes, UA NEGATIVE NEGATIVE    Comment: MICROSCOPIC NOT DONE ON URINES WITH NEGATIVE PROTEIN, BLOOD, LEUKOCYTES, NITRITE, OR GLUCOSE <1000 mg/dL.    Observation Level/Precautions:  15 minute checks  Laboratory:  Reviewed admission labs including urine drug screen positive for multiple drugs of abuse  Psychotherapy:  Group therapies    Medications:  Fluoxetine 10 mg daily for depression and hydroxyzine 50 mg at bedtime for insomnia, Nicorette gum as needed for cravings   Consultations:  None   Discharge Concerns:  Safety   Estimated LOS: 5-7 days   Other:     Psychological Evaluations: Yes   Treatment Plan Summary: Daily contact with patient to assess and evaluate symptoms and progress in treatment and Medication management  Medical Decision Making:  New problem, with additional work up planned, Review of Psycho-Social Stressors (1), Review or order clinical lab tests (1), Established Problem, Worsening (2), Review of Last Therapy Session (1), Review of Medication Regimen & Side Effects (2) and Review of New Medication or Change in Dosage (2)  I certify that inpatient services furnished can reasonably be expected to improve the patient's condition.   Tavi Hoogendoorn,JANARDHAHA R. 6/5/20163:10 PM

## 2015-03-14 NOTE — Progress Notes (Signed)
Patient ID: Louis Scott, male   DOB: 07/14/1986, 29 y.o.   MRN: [id number] D: Patient alert and cooperative. Pt reports he talked to his mother and ex-girlfriend is willing to let him see her and the child. Pt reports he is happy with the situation and plans to visit her in [place]. Pt reports plans to discharge soon to for a job interview on wednesday. Pt reports decreased anxiety and depressive symptoms. Pt reports he has been present for groups. Pt denies auditory and visual hallucination.  Cooperative with assessment.    A: Met with pt 1:1. Medications administered as prescribed. Writer encouraged pt to discuss feelings. Pt encouraged to come to staff with any questions or concerns.   R: Patient is safe on the unit. He is complaint with medications and denies any adverse reaction.

## 2015-03-14 NOTE — [facility] Group Notes (Signed)
[facility] Group Notes:  (Clinical Social Work)  03/14/2015  1:15-2:15PM  Summary of Progress/Problems:   The main focus of today's process group was to   1)  discuss the importance of adding supports  2)  define health supports versus unhealthy supports  3)  identify the patient's current unhealthy supports and plan how to handle them  4)  Identify the patient's current healthy supports and plan what to add.  An emphasis was placed on using counselor, doctor, therapy groups, 12-step groups, and problem-specific support groups to expand supports.    The patient expressed full comprehension of the concepts presented, and agreed that there is a need to add more supports.  The patient stated his mother and children (aged 5yo, 63mo, and one on the way) are healthy supports.  He stayed in the room for the remainder of the group, did not form eye contact or follow conversation.  Type of Therapy:  Process Group with Motivational Interviewing  Participation Level:  Minimal  Participation Quality:  Inattentive  Affect:  Blunted  Cognitive:  Alert  Insight:  Improving  Engagement in Therapy:  Limited  Modes of Intervention:   Education, Support and Processing, Activity  [name], LCSW 03/14/2015

## 2015-03-14 NOTE — Progress Notes (Signed)
29 year old male pt admitted on voluntary basis. Pt reports that he took an overdose as he has been having problems with his girlfriend and told his family after he took the overdose and was brought to the hospital. Pt spoke about how he would like to work on his anxiety and depression while he is here. Pt denies any SI on admission and able to contract for safety on the unit. Pt reports that he has been on medications in the past, is not on any now, and when he was on them didn't find them to be very helpful. Pt was oriented to the unit and safety maintained.

## 2015-03-14 NOTE — [facility] Suicide Risk Assessment (Signed)
[facility] Admission Suicide Risk Assessment   Nursing information obtained from:    Demographic factors:    Current Mental Status:    Loss Factors:    Historical Factors:    Risk Reduction Factors:    Total Time spent with patient: 1 hour Principal Problem: <principal problem not specified> Diagnosis:   Patient Active Problem List   Diagnosis Date Noted  . 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 Cannabis abuse [F12.10] 06/18/2012     Continued Clinical Symptoms:  Alcohol Use Disorder Identification Test Final Score (AUDIT): 5 The "Alcohol Use Disorders Identification Test", Guidelines for Use in Primary Care, Second Edition.  World Science writer [facility]). Score between 0-7:  no or low risk or alcohol related problems. Score between 8-15:  moderate risk of alcohol related problems. Score between 16-19:  high risk of alcohol related problems. Score 20 or above:  warrants further diagnostic evaluation for alcohol dependence and treatment.   CLINICAL FACTORS:   Severe Anxiety and/or Agitation Depression:   Anhedonia Comorbid alcohol abuse/dependence Hopelessness Impulsivity Insomnia Recent sense of peace/wellbeing Severe Alcohol/Substance Abuse/Dependencies Unstable or Poor Therapeutic Relationship   Musculoskeletal: Strength & Muscle Tone: within normal limits Gait & Station: normal Patient leans: N/A  Psychiatric Specialty Exam: Physical Exam  ROS No Fever-chills, No Headache, No changes with Vision or hearing, reports vertigo No problems swallowing food or Liquids, No Chest pain, Cough or Shortness of Breath, No Abdominal pain, No Nausea or Vommitting, Bowel movements are regular, No Blood in stool or Urine, No dysuria, No new skin rashes or bruises, No new joints pains-aches,  No new weakness, tingling, numbness in any extremity, No recent weight gain or loss, No polyuria, polydypsia or polyphagia,   A full 10 point Review of Systems was done,  except as stated above, all other Review of Systems were negative.  Blood pressure 128/73, pulse 91, temperature 97.7 F (36.5 C), temperature source Oral, resp. rate 18, height  (1.651 m), weight 63.504 kg (140 lb).Body mass index is 23.3 kg/(m^2).  General Appearance: Guarded  Eye Contact::  Good  Speech:  Clear and Coherent  Volume:  Decreased  Mood:  Anxious, Depressed, Hopeless and Worthless  Affect:  Constricted and Depressed  Thought Process:  Coherent and Goal Directed  Orientation:  Full (Time, Place, and Person)  Thought Content:  Rumination  Suicidal Thoughts:  Yes.  with intent/plan  Homicidal Thoughts:  No  Memory:  Immediate;   Fair Recent;   Fair  Judgement:  Impaired  Insight:  Lacking  Psychomotor Activity:  Normal  Concentration:  Good  Recall:  Fair  Fund of Knowledge:Good  Language: Good  Akathisia:  Negative  Handed:  Right  AIMS (if indicated):     Assets:  Communication Skills Desire for Improvement Financial Resources/Insurance Housing Leisure Time Physical Health Resilience Social Support Talents/Skills Transportation Vocational/Educational  Sleep:     Cognition: WNL  ADL's:  Intact     COGNITIVE FEATURES THAT CONTRIBUTE TO RISK:  Closed-mindedness, Loss of executive function and Polarized thinking    SUICIDE RISK:   Moderate:  Frequent suicidal ideation with limited intensity, and duration, some specificity in terms of plans, no associated intent, good self-control, limited dysphoria/symptomatology, some risk factors present, and identifiable protective factors, including available and accessible social support.  PLAN OF CARE: Admit for safety monitoring, crisis stabilization and medication management for depression and substance abuse  Medical Decision Making:  Review of Psycho-Social Stressors (1), Review or order clinical lab tests (1), Established  Problem, Worsening (2), Review of Last Therapy Session (1), Review of Medication  Regimen & Side Effects (2) and Review of New Medication or Change in Dosage (2)  I certify that inpatient services furnished can reasonably be expected to improve the patient's condition.   Lataysha Vohra,JANARDHAHA R. 03/14/2015, 3:07 PM

## 2015-03-14 NOTE — Progress Notes (Signed)
Pt put in 72 hour notice for D/C on 03/14/2015 @ 1435.

## 2015-03-14 NOTE — Progress Notes (Signed)
D. Pt observed in room laying in bed majority of the shift. He did not eat breakfast, reports he was not hungry, but ate lunch. He reports he had a suicide attempt, but was just seeking attention. "I knew I was not going to die, I was trying to get attention, I don't want meds or anything."  Behavior calm. He reports depression 4/10 and anxiety 4/10; both on 1-10 scale, 10 being the worse. "You see my girlfriend is 4 months pregnant, Im scared she is trying to keep the baby from me, she left and went back to her parents in another state." Pt denies SI/HI A/V/H. He also denies any physical pain. He reports he does not need to be here. "I have a job interview, this is a big deal, I have felonies."   A. Pt remains on special checks q 15 mins for safety. Encouragement and counseling provided.    R. Safety maintained. Pt put in 72 hour notice to leave at 1435

## 2015-03-15 ENCOUNTER — Encounter (HOSPITAL_COMMUNITY): Payer: Self-pay | Admitting: Psychiatry

## 2015-03-15 DIAGNOSIS — T39312A Poisoning by propionic acid derivatives, intentional self-harm, initial encounter: Secondary | ICD-10-CM

## 2015-03-15 DIAGNOSIS — X58XXXA Exposure to other specified factors, initial encounter: Secondary | ICD-10-CM

## 2015-03-15 DIAGNOSIS — T1491 Suicide attempt: Secondary | ICD-10-CM

## 2015-03-15 NOTE — [facility] Group Notes (Signed)
[facility] LCSW Group Therapy          Overcoming Obstacles       1:15 -2:30        03/15/2015   2:31 PM     Type of Therapy:  Group Therapy  Participation Level:  Appropriate  Participation Quality:  Appropriate  Affect:  Appropriate, Alert  Cognitive:  Attentive Appropriate  Insight: Developing/Improving Engaged  Engagement in Therapy: Developing/Imprvoing Engaged  Modes of Intervention:  Discussion Exploration  Education Rapport BuildingProblem-Solving Support  Summary of Progress/Problems:  The main focus of today's group was overcoming obstacles. Patient shared the obstacle he has to overcomes is not overreacting.  He shared he should have taken time to step back and think about the situation before overdosing to get attention.  Wynn BankerHodnett, Kimothy Kishimoto Hairston 03/15/2015   2:31 PM
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
## 2015-03-15 NOTE — H&P (Signed)
Psychiatric Admission Assessment Adult  Patient Identification: Louis Scott MRN:  [id number] Date of Evaluation:  03/15/2015 Chief Complaint: " I guess I overdosed, it really was just trying to get attention, now I feel embarrassed about it " Principal Diagnosis: Drug overdose, intentional Diagnosis:   Patient Active Problem List   Diagnosis Date Noted  . Polysubstance dependence including opioid type drug, episodic abuse [F11.229] 03/14/2015  . Substance induced mood disorder [F19.94] 03/14/2015  . Drug overdose, intentional [T50.902A] 03/14/2015  . 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 Cannabis abuse [F12.10] 06/18/2012   History of Present Illness:: 29 year old man, who is status post overdosing on medications. States he has had increased conflict with girlfriend, who is currently   4 months  pregnant. Girlfriend moved out of state to [place] two weeks ago. He states he had been very concerned that she would not allow him to see his child once it is born, and in this context he has been feeling increasingly depressed.  He states that he impulsively overdosed on Ibuprofen. States " I really was not trying to take my life, I guess I was really trying to get her attention, that's all". States that since he has been in the hospital his mother has contacted girlfriend and " she will let me see my child, so there really is not a problem anymore ".  At this time he is future oriented and hoping for discharge soon as he has a job he  Is fearful of losing otherwise . Of note, he has been off psychiatric medications for years.  He states that he has been sad " on and off " over the last few weeks, which he attributes to above stressors . Elements:   Recent overdose in the context of significant stressors, primarily related to relationship with GF. Prior history of psychiatric illness- no medications in several years. Drug abuse likely also contributed, as UDS positive for opiates  and cannabis  Associated Signs/Symptoms: Depression Symptoms:  depressed mood, anhedonia, suicidal attempt, anxiety, (Hypo) Manic Symptoms: denies  Anxiety Symptoms:   Some anxiety. Denies panic attacks  Psychotic Symptoms:   Denies  PTSD Symptoms: Denies  Total Time spent with patient: 45 minutes    Past Psychiatric History- states he has a history of depression , but this is the " worst one " he has had. Denies history of mania or hypomania, denies any history of violence, denies any history of psychosis. Of note , he was diagnosed with Schizophrenia a few years ago- he denies any history of psychosis, or of negative symptoms, and states he had an hallucinatory experience as a child, which did not recur. Has had one prior psychiatric admission several  years ago. Reports he had brief visual hallucinations then, which were short lived, and have not recurred . No prior history of suicide attempts. No history of self cutting . No current outpatient psychiatrist or therapist .  Past Medical History:  NKDA. Smokes 1 PPD  Past Medical History  Diagnosis Date  . Asthma   . Hallucination, visual   . Schizophrenic disorder     Past Surgical History  Procedure Laterality Date  . No past surgeries     Family History:  Parents alive, separated, has four siblings, no history of mental illness in family, no history of suicides in family, grandfather was alcoholic  Social History: single, two children ( ages 82, 2 ) - one child lives out of state with mother, and he does  have shared custody of his youngest. As noted, has had conflict with GF , who is currently pregnant. Legal history- has been incarcerated in the past, due to drug related offenses . Has a job, as a Training and development officer at [employer] . Lives with grandfather. History  Alcohol Use  . Yes    Comment: 6 pk beer weekly     History  Drug Use  . Yes  . Special: Marijuana    Comment: THC last use 3 weeks ago    History   Social History   . Marital Status: Single    Spouse Name: N/A  . Number of Children: N/A  . Years of Education: N/A   Social History Main Topics  . Smoking status: Current Every Day Smoker -- 1.00 packs/day for 10 years    Types: Cigarettes  . Smokeless tobacco: Not on file  . Alcohol Use: Yes     Comment: 6 pk beer weekly  . Drug Use: Yes    Special: Marijuana     Comment: THC last use 3 weeks ago  . Sexual Activity: Yes    Birth Control/ Protection: None   Other Topics Concern  . None   Social History Narrative   Additional Social History:  Musculoskeletal: Strength & Muscle Tone: within normal limits Gait & Station: normal Patient leans: N/A  Psychiatric Specialty Exam: Physical Exam  Review of Systems  Constitutional: Negative.   HENT: Negative.   Eyes: Negative.   Respiratory: Negative.  Negative for hemoptysis and shortness of breath.        Coughs when asthma " flares up"  Cardiovascular: Negative.   Gastrointestinal: Negative.   Genitourinary: Negative.   Musculoskeletal: Negative.   Skin: Negative.   Neurological: Negative.   Endo/Heme/Allergies: Negative.   Psychiatric/Behavioral: Positive for depression and substance abuse. The patient is nervous/anxious.     Blood pressure 122/83, pulse 91, temperature 98 F (36.7 C), temperature source Oral, resp. rate 18, height '5\' 5"'  (1.651 m), weight 140 lb (63.504 kg).Body mass index is 23.3 kg/(m^2).  General Appearance: Fairly Groomed  Engineer, water::  Good  Speech:  Normal Rate  Volume:  Normal  Mood:  Anxious and states " really not that depressed, I guess like a 3/10".  Affect:  reactive, anxious, does smile at times appropriately  Thought Process:  Goal Directed and Linear  Orientation:  Full (Time, Place, and Person)  Thought Content:  denies hallucinations, no delusions , not internally preoccupied   Suicidal Thoughts:  No denies any thoughts of hurting self or anyone else, also specifically denies any HI towards GF.  Contracts for safety on the unit .  Homicidal Thoughts:  No  Memory:  recent and remote grossly intact   Judgement:  Fair  Insight:  Fair  Psychomotor Activity:  Normal  Concentration:  Good  Recall:  Good  Fund of Knowledge:Good  Language: Good  Akathisia:  Negative  Handed:  Right  AIMS (if indicated):     Assets:  Communication Skills Desire for Improvement Physical Health Resilience  ADL's:  Fair   Cognition: WNL  Sleep:  Number of Hours: 6.25   Risk to Self: Is patient at risk for suicide?: Yes What has been your use of drugs/alcohol within the last 12 months?: Patient reports drinking on ocassion Risk to Others:   Prior Inpatient Therapy:   Prior Outpatient Therapy:    Alcohol Screening: 1. How often do you have a drink containing alcohol?: 2 to 4 times a month  2. How many drinks containing alcohol do you have on a typical day when you are drinking?: 5 or 6 3. How often do you have six or more drinks on one occasion?: Less than monthly Preliminary Score: 3 4. How often during the last year have you found that you were not able to stop drinking once you had started?: Never 5. How often during the last year have you failed to do what was normally expected from you becasue of drinking?: Never 6. How often during the last year have you needed a first drink in the morning to get yourself going after a heavy drinking session?: Never 7. How often during the last year have you had a feeling of guilt of remorse after drinking?: Never 8. How often during the last year have you been unable to remember what happened the night before because you had been drinking?: Never 9. Have you or someone else been injured as a result of your drinking?: No 10. Has a relative or friend or a doctor or another health worker been concerned about your drinking or suggested you cut down?: No Alcohol Use Disorder Identification Test Final Score (AUDIT): 5 Brief Intervention: AUDIT score less than 7 or  less-screening does not suggest unhealthy drinking-brief intervention not indicated  Allergies:  No Known Allergies Lab Results:  Results for orders placed or performed during the hospital encounter of 03/13/15 (from the past 48 hour(s))  Acetaminophen level     Status: Abnormal   Collection Time: 03/13/15  4:23 PM  Result Value Ref Range   Acetaminophen (Tylenol), Serum <10 (L) 10 - 30 ug/mL    Comment:        THERAPEUTIC CONCENTRATIONS VARY SIGNIFICANTLY. A RANGE OF 10-30 ug/mL MAY BE AN EFFECTIVE CONCENTRATION FOR MANY PATIENTS. HOWEVER, SOME ARE BEST TREATED AT CONCENTRATIONS OUTSIDE THIS RANGE. ACETAMINOPHEN CONCENTRATIONS >150 ug/mL AT 4 HOURS AFTER INGESTION AND >50 ug/mL AT 12 HOURS AFTER INGESTION ARE OFTEN ASSOCIATED WITH TOXIC REACTIONS.   Comprehensive metabolic panel     Status: Abnormal   Collection Time: 03/13/15  4:23 PM  Result Value Ref Range   Sodium 138 135 - 145 mmol/L   Potassium 3.8 3.5 - 5.1 mmol/L   Chloride 102 101 - 111 mmol/L   CO2 26 22 - 32 mmol/L   Glucose, Bld 118 (H) 65 - 99 mg/dL   BUN 11 6 - 20 mg/dL   Creatinine, Ser 1.03 0.61 - 1.24 mg/dL   Calcium 9.6 8.9 - 10.3 mg/dL   Total Protein 6.9 6.5 - 8.1 g/dL   Albumin 4.3 3.5 - 5.0 g/dL   AST 19 15 - 41 U/L   ALT 13 (L) 17 - 63 U/L   Alkaline Phosphatase 51 38 - 126 U/L   Total Bilirubin 0.9 0.3 - 1.2 mg/dL   GFR calc non Af Amer >60 >60 mL/min   GFR calc Af Amer >60 >60 mL/min    Comment: (NOTE) The eGFR has been calculated using the CKD EPI equation. This calculation has not been validated in all clinical situations. eGFR's persistently <60 mL/min signify possible Chronic Kidney Disease.    Anion gap 10 5 - 15  CBC WITH DIFFERENTIAL     Status: Abnormal   Collection Time: 03/13/15  4:23 PM  Result Value Ref Range   WBC 6.9 4.0 - 10.5 K/uL   RBC 5.02 4.22 - 5.81 MIL/uL   Hemoglobin 16.5 13.0 - 17.0 g/dL   HCT 46.2 39.0 - 52.0 %  MCV 92.0 78.0 - 100.0 fL   MCH 32.9 26.0 - 34.0  pg   MCHC 35.7 30.0 - 36.0 g/dL   RDW 13.4 11.5 - 15.5 %   Platelets 275 150 - 400 K/uL   Neutrophils Relative % 78 (H) 43 - 77 %   Neutro Abs 5.4 1.7 - 7.7 K/uL   Lymphocytes Relative 16 12 - 46 %   Lymphs Abs 1.1 0.7 - 4.0 K/uL   Monocytes Relative 5 3 - 12 %   Monocytes Absolute 0.3 0.1 - 1.0 K/uL   Eosinophils Relative 1 0 - 5 %   Eosinophils Absolute 0.1 0.0 - 0.7 K/uL   Basophils Relative 0 0 - 1 %   Basophils Absolute 0.0 0.0 - 0.1 K/uL  Ethanol     Status: Abnormal   Collection Time: 03/13/15  4:23 PM  Result Value Ref Range   Alcohol, Ethyl (B) 13 (H) <5 mg/dL    Comment:        LOWEST DETECTABLE LIMIT FOR SERUM ALCOHOL IS 11 mg/dL FOR MEDICAL PURPOSES ONLY   Salicylate level     Status: None   Collection Time: 03/13/15  4:23 PM  Result Value Ref Range   Salicylate Lvl <8.2 2.8 - 30.0 mg/dL  Drug screen panel, emergency     Status: Abnormal   Collection Time: 03/13/15  8:13 PM  Result Value Ref Range   Opiates POSITIVE (A) NONE DETECTED   Cocaine NONE DETECTED NONE DETECTED   Benzodiazepines POSITIVE (A) NONE DETECTED   Amphetamines NONE DETECTED NONE DETECTED   Tetrahydrocannabinol POSITIVE (A) NONE DETECTED   Barbiturates POSITIVE (A) NONE DETECTED    Comment:        DRUG SCREEN FOR MEDICAL PURPOSES ONLY.  IF CONFIRMATION IS NEEDED FOR ANY PURPOSE, NOTIFY LAB WITHIN 5 DAYS.        LOWEST DETECTABLE LIMITS FOR URINE DRUG SCREEN Drug Class       Cutoff (ng/mL) Amphetamine      1000 Barbiturate      200 Benzodiazepine   800 Tricyclics       349 Opiates          300 Cocaine          300 THC              50   Urinalysis, Routine w reflex microscopic     Status: Abnormal   Collection Time: 03/13/15  8:13 PM  Result Value Ref Range   Color, Urine YELLOW YELLOW   APPearance CLEAR CLEAR   Specific Gravity, Urine 1.020 1.005 - 1.030   pH 7.5 5.0 - 8.0   Glucose, UA NEGATIVE NEGATIVE mg/dL   Hgb urine dipstick NEGATIVE NEGATIVE   Bilirubin Urine NEGATIVE  NEGATIVE   Ketones, ur 15 (A) NEGATIVE mg/dL   Protein, ur NEGATIVE NEGATIVE mg/dL   Urobilinogen, UA 0.2 0.0 - 1.0 mg/dL   Nitrite NEGATIVE NEGATIVE   Leukocytes, UA NEGATIVE NEGATIVE    Comment: MICROSCOPIC NOT DONE ON URINES WITH NEGATIVE PROTEIN, BLOOD, LEUKOCYTES, NITRITE, OR GLUCOSE <1000 mg/dL.   Current Medications: Current Facility-Administered Medications  Medication Dose Route Frequency Provider Last Rate Last Dose  . albuterol (PROVENTIL HFA;VENTOLIN HFA) 108 (90 BASE) MCG/ACT inhaler 2 puff  2 puff Inhalation Q4H PRN Dara Hoyer, PA-C   2 puff at 03/15/15 [phone]  . FLUoxetine (PROZAC) capsule 10 mg  10 mg Oral Daily Ambrose Finland, MD   10 mg at 03/15/15 0825  . hydrOXYzine (  ATARAX/VISTARIL) tablet 50 mg  50 mg Oral QHS PRN Ambrose Finland, MD   50 mg at 03/14/15 2123  . nicotine polacrilex (NICORETTE) gum 2 mg  2 mg Oral PRN Jenne Campus, MD   2 mg at 03/15/15 1257  . pneumococcal 23 valent vaccine (PNU-IMMUNE) injection 0.5 mL  0.5 mL Intramuscular Tomorrow-1000 Myer Peer Cobos, MD   0.5 mL at 03/15/15 1014   PTA Medications: Prescriptions prior to admission  Medication Sig Dispense Refill Last Dose  . albuterol (PROVENTIL HFA;VENTOLIN HFA) 108 (90 BASE) MCG/ACT inhaler Inhale 2 puffs into the lungs every 4 (four) hours as needed for wheezing or shortness of breath. 18 g 0 03/13/2015 at Unknown time  . gabapentin (NEURONTIN) 300 MG capsule Take 1 capsule (300 mg total) by mouth 3 (three) times daily at 8am, 2pm and bedtime. For anxiety and mood stabilization. (Patient not taking: Reported on 12/26/2014) 90 capsule 0   . loperamide (IMODIUM) 2 MG capsule Take 1 capsule (2 mg total) by mouth 4 (four) times daily as needed for diarrhea or loose stools. (Patient not taking: Reported on 12/26/2014) 20 capsule 0   . OLANZapine (ZYPREXA) 5 MG tablet Take 1 tablet (5 mg total) by mouth at bedtime. For clarity of thought. (Patient not taking: Reported on 10/20/2014) 30  tablet 0   . promethazine (PHENERGAN) 25 MG tablet Take 1 tablet (25 mg total) by mouth every 6 (six) hours as needed for nausea or vomiting. (Patient not taking: Reported on 03/13/2015) 30 tablet 0   . ranitidine (ZANTAC) 150 MG tablet Take 1 tablet (150 mg total) by mouth 2 (two) times daily. (Patient not taking: Reported on 03/13/2015) 60 tablet 0   . sertraline (ZOLOFT) 50 MG tablet Take 1 tablet (50 mg total) by mouth daily. For depression and anxiety. (Patient not taking: Reported on 10/20/2014) 30 tablet 0   . traZODone (DESYREL) 50 MG tablet Take 1 tablet (50 mg total) by mouth at bedtime and may repeat dose one time if needed. For sleep. (Patient not taking: Reported on 10/20/2014) 60 tablet 0     Previous Psychotropic Medications: Zoloft, Zyprexa, prescribed   " a long time ago". Had taken them for  Three months , but stopped 4-5 years ago. Has not been on any psychiatric medications since then.  Substance Abuse History in the last 12 months:  History of cannabis abuse and opiate abuse- he states " it has slowed down a lot". Did use opiates x 1 2 weeks ago but prior to that had been sober x 1 year .    Consequences of Substance Abuse: denies   Results for orders placed or performed during the hospital encounter of 03/13/15 (from the past 72 hour(s))  Acetaminophen level     Status: Abnormal   Collection Time: 03/13/15  4:23 PM  Result Value Ref Range   Acetaminophen (Tylenol), Serum <10 (L) 10 - 30 ug/mL    Comment:        THERAPEUTIC CONCENTRATIONS VARY SIGNIFICANTLY. A RANGE OF 10-30 ug/mL MAY BE AN EFFECTIVE CONCENTRATION FOR MANY PATIENTS. HOWEVER, SOME ARE BEST TREATED AT CONCENTRATIONS OUTSIDE THIS RANGE. ACETAMINOPHEN CONCENTRATIONS >150 ug/mL AT 4 HOURS AFTER INGESTION AND >50 ug/mL AT 12 HOURS AFTER INGESTION ARE OFTEN ASSOCIATED WITH TOXIC REACTIONS.   Comprehensive metabolic panel     Status: Abnormal   Collection Time: 03/13/15  4:23 PM  Result Value Ref Range    Sodium 138 135 - 145 mmol/L   Potassium 3.8  3.5 - 5.1 mmol/L   Chloride 102 101 - 111 mmol/L   CO2 26 22 - 32 mmol/L   Glucose, Bld 118 (H) 65 - 99 mg/dL   BUN 11 6 - 20 mg/dL   Creatinine, Ser 1.03 0.61 - 1.24 mg/dL   Calcium 9.6 8.9 - 10.3 mg/dL   Total Protein 6.9 6.5 - 8.1 g/dL   Albumin 4.3 3.5 - 5.0 g/dL   AST 19 15 - 41 U/L   ALT 13 (L) 17 - 63 U/L   Alkaline Phosphatase 51 38 - 126 U/L   Total Bilirubin 0.9 0.3 - 1.2 mg/dL   GFR calc non Af Amer >60 >60 mL/min   GFR calc Af Amer >60 >60 mL/min    Comment: (NOTE) The eGFR has been calculated using the CKD EPI equation. This calculation has not been validated in all clinical situations. eGFR's persistently <60 mL/min signify possible Chronic Kidney Disease.    Anion gap 10 5 - 15  CBC WITH DIFFERENTIAL     Status: Abnormal   Collection Time: 03/13/15  4:23 PM  Result Value Ref Range   WBC 6.9 4.0 - 10.5 K/uL   RBC 5.02 4.22 - 5.81 MIL/uL   Hemoglobin 16.5 13.0 - 17.0 g/dL   HCT 46.2 39.0 - 52.0 %   MCV 92.0 78.0 - 100.0 fL   MCH 32.9 26.0 - 34.0 pg   MCHC 35.7 30.0 - 36.0 g/dL   RDW 13.4 11.5 - 15.5 %   Platelets 275 150 - 400 K/uL   Neutrophils Relative % 78 (H) 43 - 77 %   Neutro Abs 5.4 1.7 - 7.7 K/uL   Lymphocytes Relative 16 12 - 46 %   Lymphs Abs 1.1 0.7 - 4.0 K/uL   Monocytes Relative 5 3 - 12 %   Monocytes Absolute 0.3 0.1 - 1.0 K/uL   Eosinophils Relative 1 0 - 5 %   Eosinophils Absolute 0.1 0.0 - 0.7 K/uL   Basophils Relative 0 0 - 1 %   Basophils Absolute 0.0 0.0 - 0.1 K/uL  Ethanol     Status: Abnormal   Collection Time: 03/13/15  4:23 PM  Result Value Ref Range   Alcohol, Ethyl (B) 13 (H) <5 mg/dL    Comment:        LOWEST DETECTABLE LIMIT FOR SERUM ALCOHOL IS 11 mg/dL FOR MEDICAL PURPOSES ONLY   Salicylate level     Status: None   Collection Time: 03/13/15  4:23 PM  Result Value Ref Range   Salicylate Lvl <1.7 2.8 - 30.0 mg/dL  Drug screen panel, emergency     Status: Abnormal    Collection Time: 03/13/15  8:13 PM  Result Value Ref Range   Opiates POSITIVE (A) NONE DETECTED   Cocaine NONE DETECTED NONE DETECTED   Benzodiazepines POSITIVE (A) NONE DETECTED   Amphetamines NONE DETECTED NONE DETECTED   Tetrahydrocannabinol POSITIVE (A) NONE DETECTED   Barbiturates POSITIVE (A) NONE DETECTED    Comment:        DRUG SCREEN FOR MEDICAL PURPOSES ONLY.  IF CONFIRMATION IS NEEDED FOR ANY PURPOSE, NOTIFY LAB WITHIN 5 DAYS.        LOWEST DETECTABLE LIMITS FOR URINE DRUG SCREEN Drug Class       Cutoff (ng/mL) Amphetamine      1000 Barbiturate      200 Benzodiazepine   616 Tricyclics       073 Opiates          300  Cocaine          300 THC              50   Urinalysis, Routine w reflex microscopic     Status: Abnormal   Collection Time: 03/13/15  8:13 PM  Result Value Ref Range   Color, Urine YELLOW YELLOW   APPearance CLEAR CLEAR   Specific Gravity, Urine 1.020 1.005 - 1.030   pH 7.5 5.0 - 8.0   Glucose, UA NEGATIVE NEGATIVE mg/dL   Hgb urine dipstick NEGATIVE NEGATIVE   Bilirubin Urine NEGATIVE NEGATIVE   Ketones, ur 15 (A) NEGATIVE mg/dL   Protein, ur NEGATIVE NEGATIVE mg/dL   Urobilinogen, UA 0.2 0.0 - 1.0 mg/dL   Nitrite NEGATIVE NEGATIVE   Leukocytes, UA NEGATIVE NEGATIVE    Comment: MICROSCOPIC NOT DONE ON URINES WITH NEGATIVE PROTEIN, BLOOD, LEUKOCYTES, NITRITE, OR GLUCOSE <1000 mg/dL.    Observation Level/Precautions:  15 minute checks  Laboratory:  as needed   Psychotherapy:  Supportive, milieu  Medications:  Now on Prozac trial- states he is tolerating well thus far . We reviewed side effects, to include risk of sexual side effects  Consultations:  As   Needed   Discharge Concerns:   Psychosocial stressors   Estimated LOS:   4 days   Other:     Psychological Evaluations: no   Treatment Plan Summary: Daily contact with patient to assess and evaluate symptoms and progress in treatment, Medication management, Plan inpatient psychiatric  admission and medication management as below   Continue Prozac 10 mgrs QDAY   Medical Decision Making:  Review of Psycho-Social Stressors (1), Review or order clinical lab tests (1), Established Problem, Worsening (2) and Review of Medication Regimen & Side Effects (2)  I certify that inpatient services furnished can reasonably be expected to improve the patient's condition.   COBOS, FERNANDO 6/6/20161:58 PM

## 2015-03-15 NOTE — Progress Notes (Signed)
D: Patient appear anxious, pacing the hallway. Complained of chest pain and anxiety. PRN of albuterol inhaler and vistaril given respectively. Denies pain, SI, AH/VH at this time.  A: Support and encouragement offered to patient. Due medications given as ordered. Every 15 minutes check for safety maintained. Will continue to monitor for safety and stability.   R: Patient receptive to nursing intervention.

## 2015-03-15 NOTE — Progress Notes (Signed)
NSG shift assessment. 7a-7p.   D: Affect blunted, mood depressed, behavior appropriate. Attends groups and participates. Cooperative with staff and is getting along well with peers. Today his goal is to understand what he did wrong. He rates his depression 1 on a scale of10 with 10 being the worst and he rates anxiety 1 out of 10. He does not feel hopeless today. He has frequently sought nicotine gum and his inhaler for SOB.  A: Observed pt interacting in group and in the milieu: Support and encouragement offered. Safety maintained with observations every 15 minutes.   R:   Contracts for safety and continues to follow the treatment plan, working on learning new coping skills.

## 2015-03-15 NOTE — Progress Notes (Signed)
Recreation Therapy Notes  Date: 06.06.16 Time: 9:30 am Location: [address] Group Room  Group Topic: Coping Skills  Goal Area(s) Addresses:  Patient will verbalize importance of using healthy stress management. Patient will identify positive emotions associated with healthy stress management.  Intervention: Stress Management  Activity: Healthy Relaxation Guided Imagery.  LRT introduced and educated patients on stress management technique of guided imagery.  Script was used to deliver the technique to patients.  Patients were asked to follow script read aloud by LRT to engage in practicing the stress management technique.   Education: Coping Skills, Discharge Planning.   Education Outcome: Acknowledges understanding/In group clarification offered/Needs additional education.   Clinical Observations/Feedback: Patient did not attend group.    Jaryah Aracena, LRT/CTRS         Justeen Hehr A 03/15/2015 3:29 PM 

## 2015-03-15 NOTE — Plan of Care (Signed)
Problem: Diagnosis: Increased Risk For Suicide Attempt Goal: STG-Patient Will Attend All Groups On The Unit Outcome: Progressing Pt attended evening wrap up group     

## 2015-03-15 NOTE — [facility] Suicide Risk Assessment (Signed)
[facility]   Nursing information obtained from:    Demographic factors:   29 year old male, lives with grandfather, two children who live with mother  Current Mental Status:   see below  Loss Factors:   recent break up with GF who is pregnant and has moved out of state  Historical Factors:   history of drug abuse, remote history of brief psychosis  Risk Reduction Factors:   resilience Total Time spent with patient: 45 minutes Principal Problem: Drug overdose, intentional Diagnosis:   Patient Active Problem List   Diagnosis Date Noted  . Polysubstance dependence including opioid type drug, episodic abuse [F11.229] 03/14/2015  . Substance induced mood disorder [F19.94] 03/14/2015  . Drug overdose, intentional [T50.902A] 03/14/2015  . 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 Cannabis abuse [F12.10] 06/18/2012     Continued Clinical Symptoms:  Alcohol Use Disorder Identification Test Final Score (AUDIT): 5 The "Alcohol Use Disorders Identification Test", Guidelines for Use in Primary Care, Second Edition.  World Science writerHealth Organization [facility](WHO). Score between 0-7:  no or low risk or alcohol related problems. Score between 8-15:  moderate risk of alcohol related problems. Score between 16-19:  high risk of alcohol related problems. Score 20 or above:  warrants further diagnostic evaluation for alcohol dependence and treatment.   CLINICAL FACTORS:  29 year old male, who reports overdosed impulsively on NSAID, in the context of relationship stressors. His girlfriend, who is currently pregnant , left him and moved out of state. He had not been on any psychiatric medications for months. Has a history of opiate and cannabis abuse- states he has been using less and more infrequently, but UDS is positive for both. He is currently endorsing mild depressive symptoms, but stating overdose was an attempt to seek attention from GF . Denies any current SI, and is future  oriented, wanting to return to work soon in order not to jeopardize his job .   Musculoskeletal: Strength & Muscle Tone: within normal limits Gait & Station: normal Patient leans: N/A  Psychiatric Specialty Exam: Physical Exam  ROS  Blood pressure 122/83, pulse 91, temperature 98 F (36.7 C), temperature source Oral, resp. rate 18, height 5\' 5"  (1.651 m), weight 140 lb (63.504 kg).Body mass index is 23.3 kg/(m^2).  SEE ADMIT NOTE MSE                                                        COGNITIVE FEATURES THAT CONTRIBUTE TO RISK:  Closed-mindedness    SUICIDE RISK:   Moderate:  Frequent suicidal ideation with limited intensity, and duration, some specificity in terms of plans, no associated intent, good self-control, limited dysphoria/symptomatology, some risk factors present, and identifiable protective factors, including available and accessible social support.  PLAN OF CARE: Patient will be admitted to inpatient psychiatric unit for stabilization and safety. Will provide and encourage milieu participation. Provide medication management and maked adjustments as needed.  Will follow daily.    Medical Decision Making:  Review of Psycho-Social Stressors (1), Review or order clinical lab tests (1), Established Problem, Worsening (2) and Review of Medication Regimen & Side Effects (2)  I certify that inpatient services furnished can reasonably be expected to improve the patient's condition.   Sabrie Moritz 03/15/2015, 2:38 PM

## 2015-03-15 NOTE — [facility] Group Notes (Signed)
[facility]   03/15/2015 1:13 PM    Participation Quality:  Appropraite  Mood/Affect:  Appropriate  Depression Rating:  2  Anxiety Rating:  2  Thoughts of Suicide:  No  Will you contract for safety?   NA  Current AVH:  No  Plan for Discharge/Comments:  Patient attended discharge planning group and actively participated in group. He reports not having an outpatient provider but [facility] [place]. Suicide prevention education reviewed and SPE document provided.   Transportation Means: Patient has transportation.   Supports:  Patient has a support system.   Noriel Guthrie, Joesph JulyQuylle Hairston

## 2015-03-16 DIAGNOSIS — F329 Major depressive disorder, single episode, unspecified: Secondary | ICD-10-CM | POA: Diagnosis present

## 2015-03-16 MED ORDER — ALBUTEROL SULFATE HFA 108 (90 BASE) MCG/ACT IN AERS: 2.0000 | INHALATION_SPRAY | RESPIRATORY_TRACT | Status: DC | PRN

## 2015-03-16 MED ORDER — RANITIDINE HCL 150 MG PO TABS: 150.0000 mg | ORAL_TABLET | Freq: Two times a day (BID) | ORAL | Status: DC

## 2015-03-16 MED ORDER — NICOTINE POLACRILEX 2 MG MT GUM: 2.0000 mg | CHEWING_GUM | OROMUCOSAL | Status: DC | PRN

## 2015-03-16 MED ORDER — FLUOXETINE HCL 10 MG PO CAPS: 10.0000 mg | ORAL_CAPSULE | Freq: Every day | ORAL | Status: DC

## 2015-03-16 MED ORDER — HYDROXYZINE HCL 50 MG PO TABS: 50.0000 mg | ORAL_TABLET | Freq: Every evening | ORAL | Status: DC | PRN

## 2015-03-16 MED ORDER — ALUM & MAG HYDROXIDE-SIMETH 200-200-20 MG/5ML PO SUSP: 30.0000 mL | ORAL | Status: DC | PRN

## 2015-03-16 MED ORDER — ACETAMINOPHEN 325 MG PO TABS: 650.0000 mg | ORAL_TABLET | Freq: Four times a day (QID) | ORAL | Status: DC | PRN

## 2015-03-16 MED ORDER — MAGNESIUM HYDROXIDE 400 MG/5ML PO SUSP: 30.0000 mL | Freq: Every day | ORAL | Status: DC | PRN

## 2015-03-16 NOTE — Tx Team (Signed)
Interdisciplinary Treatment Plan Update (Adult)  Date:  03/16/2015  Time Reviewed:  9:28 AM   Progress in Treatment: Attending groups: Patient is attending groups. Participating in groups:  Patient engages in discussion Taking medication as prescribed:  Patient is taking medications Tolerating medication:  Patient is tolerating medications Family/Significant othe contact made:   Yes, collateral contact with mother.   Patient understands diagnosis:Yes, patient understands diagnosis and need for treatment Discussing patient identified problems/goals with staff:  Yes, patient is able to express goals/problems Medical problems stabilized or resolved:  Yes Denies suicidal/homicidal ideation: Yes, patient is denying SI/HI. Issues/concerns per patient self-inventory:   Other:  Discharge Plan or Barriers:  Home with follow up with [facility] [place]  Reason for Continuation of Hospitalization:  Comments:  Additional comments:  Patient and CSW reviewed Patient Discharge Process Letter/Patient Involvement Form.  Patient verbalized understanding and signed form.  Patient and CSW also reviewed and identified patient's goals and treatment plan.  Patient verbalized understanding and agreed to plan.  Estimated length of stay:  Discharge today  New goal(s):  Review of initial/current patient goals per problem list:  Please see plan of careInterdisciplinary Treatment Plan Update (Adult)  Attendees: Patient 03/16/2015 9:28 AM   Family:   03/16/2015 9:28 AM   Physician:  Nehemiah MassedFernando Cobos, MD 03/16/2015 9:28 AM   Nursing:   Rodman KeyJanet Webb, RN 03/16/2015 9:28 AM   Clinical Social Worker:  Juline PatchQuylle Shermon Bozzi, LCSW 03/16/2015 9:28 AM   Clinical Social Worker:  Belenda CruiseKristin Drinkard, LCSW-A 03/16/2015 9:28 AM   Case Manager:  Onnie BoerJennifer Clark, RN 03/16/2015 9:28 AM   Other:  Aloha GellKrista Dopson, RN 03/16/2015 9:28 AM  Other:   03/16/2015  9:28 AM   Other:  03/16/2015 9:28 AM   Other:  03/16/2015 9:28 AM   Other:  03/16/2015 9:28 AM   Other:   Chad CordialValerie Enoch, [facility] Transition Team Coordinator 03/16/2015 9:28 AM   Other:   03/16/2015 9:28 AM   Other:  03/16/2015 9:28 AM   Other:   03/16/2015 9:28 AM    Scribe for Treatment Team:   Wynn BankerHodnett, Willett Lefeber Hairston, 03/16/2015   9:28 AM

## 2015-03-16 NOTE — Progress Notes (Addendum)
  [facility] :  Will you be returning to the same living situation after discharge:  Yes,  Patient is returning home with family. At discharge, do you have transportation home?: Yes,  Patient to arrange transportation. Do you have the ability to pay for your medications: No.  Patient needs assistance with indigent medications   Release of information consent forms completed and in the chart;  Patient's signature needed at discharge.  Patient to Follow up at: Follow-up Information    Follow up with [facility] On 03/18/2015.   Why:  You are scheduled with [facility] on Thursdsay, March 18, 2015 at [facility]   Contact information:   [address] [place], [place]   [id number]  [phone]      Patient denies SI/HI: Patient no longer endorsing SI/HI or other thoughts of self harm.   Safety Planning and Suicide Prevention discussed: .Reviewed with all patients during discharge planning group   Have you used any form of tobacco in the last 30 days? (Cigarettes, Smokeless Tobacco, Cigars, and/or Pipes): Yes   Referral made to the Quitline on 6//16.   Wynn BankerHodnett, Louis Scott 03/16/2015, 12:54 PM

## 2015-03-16 NOTE — BHH Suicide Risk Assessment (Signed)
[facility]   Demographic Factors:  [age] year old single male, lives with grandfather, two children who live with their mothers, employed  Total Time spent with patient: 30 minutes  Musculoskeletal: Strength & Muscle Tone: within normal limits Gait & Station: normal Patient leans: N/A  Psychiatric Specialty Exam: Physical Exam  ROS  Blood pressure 121/67, pulse 83, temperature 98.1 F (36.7 C), temperature source Oral, resp. rate 18, height 5\' 5"  (1.651 m), weight 140 lb (63.504 kg).Body mass index is 23.3 kg/(m^2).  General Appearance: improved grooming   Eye Contact::  Good  Speech:  Normal Rate409  Volume:  Normal  Mood:  improved and currently euthymic, denies depression  Affect:  Appropriate and reactive   Thought Process:  Goal Directed and Linear  Orientation:  Full (Time, Place, and Person)  Thought Content:  less ruminative about stressors, more future oriented, focused on returning to work, no hallucinations,no delusions  Suicidal Thoughts:  No- denies any thoughts of hurting self or anyone else   Homicidal Thoughts:  No  Memory: Recent and Remote grossly intact  Judgement:  Other:  improved   Insight:  improved   Psychomotor Activity:  Normal  Concentration:  Good  Recall:  Good  Fund of Knowledge:Good  Language: Good  Akathisia:  Negative  Handed:  Right  AIMS (if indicated):     Assets:  Communication Skills Desire for Improvement Resilience Vocational/Educational  Sleep:  Number of Hours: 6.25  Cognition: WNL  ADL's:  Improved    Have you used any form of tobacco in the last 30 days? (Cigarettes, Smokeless Tobacco, Cigars, and/or Pipes): Yes  Has this patient used any form of tobacco in the last 30 days? (Cigarettes, Smokeless Tobacco, Cigars, and/or Pipes) Yes, A prescription for an FDA-approved tobacco cessation medication was offered at discharge and the patient refused  Mental Status Per Nursing Assessment::   On Admission:      Current Mental Status by Physician: As noted above, at this time patient is alert and attentive, well related, pleasant, behavior on unit is calm and in good control, no thought disorder, denies depression and mood seems improved/ currently euthymic, affect is reactive, smiles often and appropriately, no suicidal or homicidal ideations, no psychotic symptoms, future oriented, looking forward to return to work tomorrow.  Loss Factors: Recent separation from GF, who is currently pregnant , and who moved out of state   Historical Factors: History of depression and of  substance abuse- in the past had been diagnosed with Schizophrenia, and reports brief hallucinatory episode many years ago, but course of illness and presentation not suggestive of schizophrenia at this time.   Risk Reduction Factors:   Sense of responsibility to family, Employed, Living with another person, especially a relative and Positive coping skills or problem solving skills  Continued Clinical Symptoms:  As noted,currently much  Improved compared to admission, presents euthymic, with full range of affect, no SI or HI, no psychotic symptoms, behavior calm and in control.  Cognitive Features That Contribute To Risk:  No gross cognitive deficits noted upon discharge. Is alert , attentive, and oriented x 3    Suicide Risk:  Mild:  Suicidal ideation of limited frequency, intensity, duration, and specificity.  There are no identifiable plans, no associated intent, mild dysphoria and related symptoms, good self-control (both objective and subjective assessment), few other risk factors, and identifiable protective factors, including available and accessible social support.  Principal Problem: Drug overdose, intentional Discharge Diagnoses:  Patient  Active Problem List   Diagnosis Date Noted  . MDD (major depressive disorder) [F32.2] 03/16/2015  . Polysubstance dependence including opioid type drug, episodic abuse [F11.229]  03/14/2015  . Substance induced mood disorder [F19.94] 03/14/2015  . Drug overdose, intentional [T50.902A] 03/14/2015  . 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 Cannabis abuse [F12.10] 06/18/2012    Follow-up Information    Follow up with [facility].   Why:  You are scheduled with [facility] on    Contact information:   [address] [place], [place]   [address]  [phone]      Plan Of Care/Follow-up recommendations:  Activity:  as tolerated Diet:  Regular Tests:  NA Other:  See below  Is patient on multiple antipsychotic therapies at discharge:  No   Has Patient had three or more failed trials of antipsychotic monotherapy by history:  No  Recommended Plan for Multiple Antipsychotic Therapies: NA   Patient has requested discharge and has submitted letter requesting to be discharged- there are no current grounds for involuntary commitment. Patient is leaving unit in good spirits. Plans to return to live with family member. Follow up as above. States he plans to maintain sobriety.     COBOS, FERNANDO 03/16/2015, 12:02 PM

## 2015-03-16 NOTE — Progress Notes (Signed)
Recreation Therapy Notes  Animal-Assisted Activity (AAA) Program Checklist/Progress Notes Patient Eligibility Criteria Checklist & Daily Group note for Rec Tx Intervention  Date: 06.07.16 Time: 2:30pm [facility] [address]   AAA/T Program Assumption of Risk Form signed by Patient/ or Parent Legal Guardian yes  Patient is free of allergies or sever asthma yes  Patient reports no fear of animals yes  Patient reports no history of cruelty to animals yes  Patient understands his/her participation is voluntary yes  Patient washes hands before animal contact yes  Patient washes hands after animal contact yes  Behavioral Response: Engaged  Education: Charity fundraiserHand Washing, Appropriate Animal Interaction   Education Outcome: Acknowledges understanding/In group clarification offered/Needs additional education.   Clinical Observations/Feedback: Patient attended group and pet the dog.   Caroll RancherMarjette Janalyn Higby, LRT/CTRS         Caroll RancherLindsay, Emerie Vanderkolk A 03/16/2015 3:57 PM

## 2015-03-16 NOTE — Progress Notes (Deleted)
Recreation Therapy Notes  Animal-Assisted Activity (AAA) Program Checklist/Progress Notes Patient Eligibility Criteria Checklist & Daily Group note for Rec Tx Intervention  Date: 06.07.16 Time: 2:30pm Location: [address]   AAA/T Program Assumption of Risk Form signed by Patient/ or Parent Legal Guardian yes  Patient is free of allergies or sever asthma yes  Patient reports no fear of animals yes  Patient reports no history of cruelty to animals yes  Patient understands his/her participation is voluntary yes  Patient washes hands before animal contact yes  Patient washes hands after animal contact yes  Behavioral Response: Engaged  Education: Hand Washing, Appropriate Animal Interaction   Education Outcome: Acknowledges understanding/In group clarification offered/Needs additional education.   Clinical Observations/Feedback: Patient attended group and pet the dog.   Louis Scott, LRT/CTRS         Louis Scott 03/16/2015 3:57 PM 

## 2015-03-16 NOTE — Discharge Summary (Signed)
Physician Discharge Summary Note  Patient:  Louis Scott is an 29 y.o., male  MRN:  [id number]  DOB:  Aug 10, 1986  Patient phone:  [phone] (home)   Patient address:   [address] [place] [address],   Total Time spent with patient: Greater than 30 minutes  Date of Admission:  03/14/2015  Date of Discharge: 03-16-15  Reason for Admission: Suicide attempt by overdose, impulsive behavior  Principal Problem: Drug overdose, intentional  Discharge Diagnoses: Patient Active Problem List   Diagnosis Date Noted  . MDD (major depressive disorder) [F32.2] 03/16/2015  . Polysubstance dependence including opioid type drug, episodic abuse [F11.229] 03/14/2015  . Substance induced mood disorder [F19.94] 03/14/2015  . Drug overdose, intentional [T50.902A] 03/14/2015  . 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 Cannabis abuse [F12.10] 06/18/2012   Musculoskeletal: Strength & Muscle Tone: within normal limits Gait & Station: normal Patient leans: N/A  Psychiatric Specialty Exam: Physical Exam  Psychiatric: His speech is normal and behavior is normal. Judgment and thought content normal. His mood appears not anxious. His affect is not angry, not blunt, not labile and not inappropriate. Cognition and memory are normal. He does not exhibit a depressed mood.    Review of Systems  Constitutional: Negative.   HENT: Negative.   Eyes: Negative.   Respiratory: Negative.   Cardiovascular: Negative.   Gastrointestinal: Negative.   Genitourinary: Negative.   Musculoskeletal: Negative.   Skin: Negative.   Endo/Heme/Allergies: Negative.   Psychiatric/Behavioral: Positive for depression (Stable) and substance abuse (Hx. Polysubstance dependence). Negative for suicidal ideas, hallucinations and memory loss. The patient has insomnia (Stable). The patient is not nervous/anxious.     Blood pressure 121/67, pulse 83, temperature 98.1 F (36.7 C), temperature source Oral, resp.  rate 18, height '5\' 5"'  (1.651 m), weight 63.504 kg (140 lb).Body mass index is 23.3 kg/(m^2).  See Md's SRA   Have you used any form of tobacco in the last 30 days? (Cigarettes, Smokeless Tobacco, Cigars, and/or Pipes): Yes  Has this patient used any form of tobacco in the last 30 days? (Cigarettes, Smokeless Tobacco, Cigars, and/or Pipes) Yes, A prescription for an FDA-approved tobacco cessation medication was offered at discharge and the patient refused  Past Medical History:  Past Medical History  Diagnosis Date  . Asthma   . Hallucination, visual   . Schizophrenic disorder     Past Surgical History  Procedure Laterality Date  . No past surgeries     Family History: History reviewed. No pertinent family history.  Social History:  History  Alcohol Use  . Yes    Comment: 6 pk beer weekly     History  Drug Use  . Yes  . Special: Marijuana    Comment: THC last use 3 weeks ago    History   Social History  . Marital Status: Single    Spouse Name: N/A  . Number of Children: N/A  . Years of Education: N/A   Social History Main Topics  . Smoking status: Current Every Day Smoker -- 1.00 packs/day for 10 years    Types: Cigarettes  . Smokeless tobacco: Not on file  . Alcohol Use: Yes     Comment: 6 pk beer weekly  . Drug Use: Yes    Special: Marijuana     Comment: THC last use 3 weeks ago  . Sexual Activity: Yes    Birth Control/ Protection: None   Other Topics Concern  . None   Social History Narrative  Risk to Self: Is patient at risk for suicide?: Yes What has been your use of drugs/alcohol within the last 12 months?: Patient reports drinking on ocassion  Risk to Others: No  Prior Inpatient Therapy: Yes  Prior Outpatient Therapy: Yes  Level of Care:  OP  Hospital Course:  29 year old man, who is status post overdosing on medications. States he has had increased conflict with girlfriend, who is currently  4 months pregnant. Girlfriend moved out of state  to [place] two weeks ago. He states he had been very concerned that she would not allow him to see his child once it is born, and in this context he has been feeling increasingly depressed.  He states that he impulsively overdosed on Ibuprofen. States " I really was not trying to take my life, I guess I was really trying to get her attention, that's all". States that since he has been in the hospital his mother has contacted girlfriend and " she will let me see my child, so there really is not a problem anymore ". At this time he is future oriented and hoping for discharge soon as he has a job he Is fearful of losing otherwise . Of note, he has been off psychiatric medications for years.   Kalup's stay in this hospital was rather very brief. Although with toxicology reports showing blood alcohol level of 13, (+) cocaine and THC per UDS test resultts, Jabarie was not presenting with any substance withdrawal symptoms upon arrival and or throughout his brief hospital stay . As a result, he did not receive any detoxification treatment protocols. His hospital admission was as a result of impulsive suicide attempt by overdose due to relationship problems. He did admit during his admission evaluation that this was an attention seeking behavior, that he does not rather want to die. Report indicated that Matvey has history of mental health disorder, and has been off of his psychiatric medications for a while.  Uno appeared to have some need for mood stabilization treatment. He was started on Prozac 10 mg for depression, Hydroxyzine 50 mg for insomnia & nicotine gum 20 mg for nicotine addiction. Dauntae has asked to be discharged today as he is no longer feeling depressed or suicidal. He says his mother has made contact with his girlfriend as both has agreed to work out their differences. Boyd wants to resume his employment as soon as it possible for fear of losing his job. He is currently being discharged  to continue psychiatric services on an outpatient basis as noted below. At the present time, he is alert and attentive, well related, pleasant, behavior on unit is calm and in good control, no thought disorder, denies depression and mood seems improved/ currently euthymic, affect is reactive, smiles often and appropriately, no suicidal or homicidal ideations, no psychotic symptoms, future oriented, looking forward to returning to work tomorrow. He left [facility] with all personal belongings in no apparent distress. Transportation per his arrangement. He was provided with a 14 days worth, supply samples of his [facility] discharge medications.  Consults:  psychiatry  Significant Diagnostic Studies:  labs: CBC with diff, CMP, UDS, toxicology tests, U/A, results reviewed, stable  Discharge Vitals:   Blood pressure 121/67, pulse 83, temperature 98.1 F (36.7 C), temperature source Oral, resp. rate 18, height '5\' 5"'  (1.651 m), weight 63.504 kg (140 lb). Body mass index is 23.3 kg/(m^2). Lab Results:   Results for orders placed or performed during the hospital encounter of 03/13/15 (  from the past 72 hour(s))  Acetaminophen level     Status: Abnormal   Collection Time: 03/13/15  4:23 PM  Result Value Ref Range   Acetaminophen (Tylenol), Serum <10 (L) 10 - 30 ug/mL    Comment:        THERAPEUTIC CONCENTRATIONS VARY SIGNIFICANTLY. A RANGE OF 10-30 ug/mL MAY BE AN EFFECTIVE CONCENTRATION FOR MANY PATIENTS. HOWEVER, SOME ARE BEST TREATED AT CONCENTRATIONS OUTSIDE THIS RANGE. ACETAMINOPHEN CONCENTRATIONS >150 ug/mL AT 4 HOURS AFTER INGESTION AND >50 ug/mL AT 12 HOURS AFTER INGESTION ARE OFTEN ASSOCIATED WITH TOXIC REACTIONS.   Comprehensive metabolic panel     Status: Abnormal   Collection Time: 03/13/15  4:23 PM  Result Value Ref Range   Sodium 138 135 - 145 mmol/L   Potassium 3.8 3.5 - 5.1 mmol/L   Chloride 102 101 - 111 mmol/L   CO2 26 22 - 32 mmol/L   Glucose, Bld 118 (H) 65 - 99 mg/dL   BUN 11 6 -  20 mg/dL   Creatinine, Ser 1.03 0.61 - 1.24 mg/dL   Calcium 9.6 8.9 - 10.3 mg/dL   Total Protein 6.9 6.5 - 8.1 g/dL   Albumin 4.3 3.5 - 5.0 g/dL   AST 19 15 - 41 U/L   ALT 13 (L) 17 - 63 U/L   Alkaline Phosphatase 51 38 - 126 U/L   Total Bilirubin 0.9 0.3 - 1.2 mg/dL   GFR calc non Af Amer >60 >60 mL/min   GFR calc Af Amer >60 >60 mL/min    Comment: (NOTE) The eGFR has been calculated using the CKD EPI equation. This calculation has not been validated in all clinical situations. eGFR's persistently <60 mL/min signify possible Chronic Kidney Disease.    Anion gap 10 5 - 15  CBC WITH DIFFERENTIAL     Status: Abnormal   Collection Time: 03/13/15  4:23 PM  Result Value Ref Range   WBC 6.9 4.0 - 10.5 K/uL   RBC 5.02 4.22 - 5.81 MIL/uL   Hemoglobin 16.5 13.0 - 17.0 g/dL   HCT 46.2 39.0 - 52.0 %   MCV 92.0 78.0 - 100.0 fL   MCH 32.9 26.0 - 34.0 pg   MCHC 35.7 30.0 - 36.0 g/dL   RDW 13.4 11.5 - 15.5 %   Platelets 275 150 - 400 K/uL   Neutrophils Relative % 78 (H) 43 - 77 %   Neutro Abs 5.4 1.7 - 7.7 K/uL   Lymphocytes Relative 16 12 - 46 %   Lymphs Abs 1.1 0.7 - 4.0 K/uL   Monocytes Relative 5 3 - 12 %   Monocytes Absolute 0.3 0.1 - 1.0 K/uL   Eosinophils Relative 1 0 - 5 %   Eosinophils Absolute 0.1 0.0 - 0.7 K/uL   Basophils Relative 0 0 - 1 %   Basophils Absolute 0.0 0.0 - 0.1 K/uL  Ethanol     Status: Abnormal   Collection Time: 03/13/15  4:23 PM  Result Value Ref Range   Alcohol, Ethyl (B) 13 (H) <5 mg/dL    Comment:        LOWEST DETECTABLE LIMIT FOR SERUM ALCOHOL IS 11 mg/dL FOR MEDICAL PURPOSES ONLY   Salicylate level     Status: None   Collection Time: 03/13/15  4:23 PM  Result Value Ref Range   Salicylate Lvl <8.6 2.8 - 30.0 mg/dL  Drug screen panel, emergency     Status: Abnormal   Collection Time: 03/13/15  8:13 PM  Result Value  Ref Range   Opiates POSITIVE (A) NONE DETECTED   Cocaine NONE DETECTED NONE DETECTED   Benzodiazepines POSITIVE (A) NONE  DETECTED   Amphetamines NONE DETECTED NONE DETECTED   Tetrahydrocannabinol POSITIVE (A) NONE DETECTED   Barbiturates POSITIVE (A) NONE DETECTED    Comment:        DRUG SCREEN FOR MEDICAL PURPOSES ONLY.  IF CONFIRMATION IS NEEDED FOR ANY PURPOSE, NOTIFY LAB WITHIN 5 DAYS.        LOWEST DETECTABLE LIMITS FOR URINE DRUG SCREEN Drug Class       Cutoff (ng/mL) Amphetamine      1000 Barbiturate      200 Benzodiazepine   283 Tricyclics       151 Opiates          300 Cocaine          300 THC              50   Urinalysis, Routine w reflex microscopic     Status: Abnormal   Collection Time: 03/13/15  8:13 PM  Result Value Ref Range   Color, Urine YELLOW YELLOW   APPearance CLEAR CLEAR   Specific Gravity, Urine 1.020 1.005 - 1.030   pH 7.5 5.0 - 8.0   Glucose, UA NEGATIVE NEGATIVE mg/dL   Hgb urine dipstick NEGATIVE NEGATIVE   Bilirubin Urine NEGATIVE NEGATIVE   Ketones, ur 15 (A) NEGATIVE mg/dL   Protein, ur NEGATIVE NEGATIVE mg/dL   Urobilinogen, UA 0.2 0.0 - 1.0 mg/dL   Nitrite NEGATIVE NEGATIVE   Leukocytes, UA NEGATIVE NEGATIVE    Comment: MICROSCOPIC NOT DONE ON URINES WITH NEGATIVE PROTEIN, BLOOD, LEUKOCYTES, NITRITE, OR GLUCOSE <1000 mg/dL.   Physical Findings:  AIMS: Facial and Oral Movements Muscles of Facial Expression: None, normal Lips and Perioral Area: None, normal Jaw: None, normal Tongue: None, normal,Extremity Movements Upper (arms, wrists, hands, fingers): None, normal Lower (legs, knees, ankles, toes): None, normal, Trunk Movements Neck, shoulders, hips: None, normal, Overall Severity Severity of abnormal movements (highest score from questions above): None, normal Incapacitation due to abnormal movements: None, normal Patient's awareness of abnormal movements (rate only patient's report): No Awareness, Dental Status Current problems with teeth and/or dentures?: No Does patient usually wear dentures?: No  CIWA:    COWS:     See Psychiatric Specialty  Exam and Suicide Risk Assessment completed by Attending Physician prior to discharge.  Discharge destination:  Home  Is patient on multiple antipsychotic therapies at discharge:  No   Has Patient had three or more failed trials of antipsychotic monotherapy by history:  No  Recommended Plan for Multiple Antipsychotic Therapies: NA    Medication List    STOP taking these medications        gabapentin 300 MG capsule  Commonly known as:  NEURONTIN     loperamide 2 MG capsule  Commonly known as:  IMODIUM     OLANZapine 5 MG tablet  Commonly known as:  ZYPREXA     promethazine 25 MG tablet  Commonly known as:  PHENERGAN     sertraline 50 MG tablet  Commonly known as:  ZOLOFT     traZODone 50 MG tablet  Commonly known as:  DESYREL      TAKE these medications      Indication   albuterol 108 (90 BASE) MCG/ACT inhaler  Commonly known as:  PROVENTIL HFA;VENTOLIN HFA  Inhale 2 puffs into the lungs every 4 (four) hours as needed for wheezing or shortness of breath.  Indication:  Asthma     FLUoxetine 10 MG capsule  Commonly known as:  PROZAC  Take 1 capsule (10 mg total) by mouth daily. For depression   Indication:  Major Depressive Disorder     hydrOXYzine 50 MG tablet  Commonly known as:  ATARAX/VISTARIL  Take 1 tablet (50 mg total) by mouth at bedtime as needed for anxiety (Insomnia). Sleep   Indication:  Insomnia     nicotine polacrilex 2 MG gum  Commonly known as:  NICORETTE  Take 1 each (2 mg total) by mouth as needed for smoking cessation.   Indication:  Nicotine Addiction     ranitidine 150 MG tablet  Commonly known as:  ZANTAC  Take 1 tablet (150 mg total) by mouth 2 (two) times daily. For acid reflux   Indication:  Gastroesophageal Reflux Disease       Follow-up Information    Follow up with [facility].   Why:  You are scheduled with [facility] on    Contact information:   [address], [place]   [address]  [phone]     Follow-up recommendations:   Activity:  As tolerated Diet: As recommended by your primary care doctor. Keep all scheduled follow-up appointments as recommended.  Comments:  Take all your medications as prescribed by your mental healthcare provider. Report any adverse effects and or reactions from your medicines to your outpatient provider promptly. Patient is instructed and cautioned to not engage in alcohol and or illegal drug use while on prescription medicines. In the event of worsening symptoms, patient is instructed to call the crisis hotline, 911 and or go to the nearest ED for appropriate evaluation and treatment of symptoms. Follow-up with your primary care provider for your other medical issues, concerns and or health care needs.   Total Discharge Time: Greater than 30 minutes Signed: Encarnacion Slates, PMHNP-BC 03/16/2015, 10:54 AM   Patient seen, Suicide Assessment Completed.  Disposition Plan Reviewed

## 2015-03-16 NOTE — [facility] Suicide Risk Assessment (Signed)
[facility] INPATIENT:  Family/Significant Other Suicide Prevention Education  Suicide Prevention Education:  Education Completed; Louis HerrlichMichele Scott, Mother - [phone]; has been identified by the patient as the family member/significant other with whom the patient will be residing, and identified as the person(s) who will aid the patient in the event of a mental health crisis (suicidal ideations/suicide attempt).  With written consent from the patient, the family member/significant other has been provided the following suicide prevention education, prior to the and/or following the discharge of the patient.  The suicide prevention education provided includes the following:  Suicide risk factors  Suicide prevention and interventions  National Suicide Hotline telephone number  [facility] assessment telephone number  [facility] Emergency Assistance 911  [facility] and/or Residential Mobile Crisis Unit telephone number  Request made of family/significant other to:  Remove weapons (e.g., guns, rifles, knives), all items previously/currently identified as safety concern.   Mother advised patient does not have access to weapons.     Remove drugs/medications (over-the-counter, prescriptions, illicit drugs), all items previously/currently identified as a safety concern.  The family member/significant other verbalizes understanding of the suicide prevention education information provided.  The family member/significant other agrees to remove the items of safety concern listed above.  Louis Scott, Louis Scott 03/16/2015, 3:34 PM

## 2015-03-16 NOTE — Progress Notes (Addendum)
Pt appears a little anxious this am. He stated he is ready to leave and is hopeful he will be discharged today. Pt stated he feels good and has no thoughts of wanting to hurt himself. He does contract for safety. Pt stated his mo would pick him up between 4p-5pm today. He stated he does not feel Si or Hi and if he did he would call 911 or come right back to the hospital. Pt appears in good spirits today.

## 2015-10-05 ENCOUNTER — Emergency Department (HOSPITAL_COMMUNITY)
Admission: EM | Admit: 2015-10-05 | Discharge: 2015-10-06 | Disposition: A | Discharge status: Home / Self Care | Payer: Self-pay | Attending: Emergency Medicine | Admitting: Emergency Medicine

## 2015-10-05 ENCOUNTER — Encounter (HOSPITAL_COMMUNITY): Payer: Self-pay

## 2015-10-05 DIAGNOSIS — F17210 Nicotine dependence, cigarettes, uncomplicated: Secondary | ICD-10-CM | POA: Insufficient documentation

## 2015-10-05 DIAGNOSIS — F209 Schizophrenia, unspecified: Secondary | ICD-10-CM | POA: Insufficient documentation

## 2015-10-05 DIAGNOSIS — Z79899 Other long term (current) drug therapy: Secondary | ICD-10-CM | POA: Insufficient documentation

## 2015-10-05 DIAGNOSIS — J45909 Unspecified asthma, uncomplicated: Secondary | ICD-10-CM | POA: Insufficient documentation

## 2015-10-05 DIAGNOSIS — F1994 Other psychoactive substance use, unspecified with psychoactive substance-induced mood disorder: Secondary | ICD-10-CM | POA: Diagnosis present

## 2015-10-05 DIAGNOSIS — F39 Unspecified mood [affective] disorder: Secondary | ICD-10-CM | POA: Insufficient documentation

## 2015-10-05 HISTORY — DX: Other psychoactive substance abuse, uncomplicated: F19.10

## 2015-10-05 LAB — CBC WITH DIFFERENTIAL/PLATELET
Basophils Absolute: 0 10*3/uL (ref 0.0–0.1)
Basophils Relative: 0 %
Eosinophils Absolute: 0.2 10*3/uL (ref 0.0–0.7)
Eosinophils Relative: 4 %
HCT: 41.2 % (ref 39.0–52.0)
HEMOGLOBIN: 15.2 g/dL (ref 13.0–17.0)
LYMPHS ABS: 2.3 10*3/uL (ref 0.7–4.0)
LYMPHS PCT: 38 %
MCH: 31.7 pg (ref 26.0–34.0)
MCHC: 36.9 g/dL — ABNORMAL HIGH (ref 30.0–36.0)
MCV: 85.8 fL (ref 78.0–100.0)
Monocytes Absolute: 0.5 10*3/uL (ref 0.1–1.0)
Monocytes Relative: 8 %
NEUTROS ABS: 2.9 10*3/uL (ref 1.7–7.7)
NEUTROS PCT: 50 %
Platelets: 293 10*3/uL (ref 150–400)
RBC: 4.8 MIL/uL (ref 4.22–5.81)
RDW: 11.8 % (ref 11.5–15.5)
WBC: 5.9 10*3/uL (ref 4.0–10.5)

## 2015-10-05 LAB — COMPREHENSIVE METABOLIC PANEL
ALT: 15 U/L — AB (ref 17–63)
ANION GAP: 13 (ref 5–15)
AST: 26 U/L (ref 15–41)
Albumin: 5 g/dL (ref 3.5–5.0)
Alkaline Phosphatase: 49 U/L (ref 38–126)
BUN: 12 mg/dL (ref 6–20)
CO2: 24 mmol/L (ref 22–32)
CREATININE: 1.02 mg/dL (ref 0.61–1.24)
Calcium: 9.8 mg/dL (ref 8.9–10.3)
Chloride: 99 mmol/L — ABNORMAL LOW (ref 101–111)
GFR calc Af Amer: >60 mL/min (ref >60)
GFR calc non Af Amer: >60 mL/min (ref >60)
Glucose, Bld: 91 mg/dL (ref 65–99)
Potassium: 3.5 mmol/L (ref 3.5–5.1)
Sodium: 136 mmol/L (ref 135–145)
Total Bilirubin: 0.8 mg/dL (ref 0.3–1.2)
Total Protein: 7.7 g/dL (ref 6.5–8.1)

## 2015-10-05 LAB — RAPID URINE DRUG SCREEN, HOSP PERFORMED
Amphetamines: POSITIVE — AB
BENZODIAZEPINES: NOT DETECTED
Barbiturates: NOT DETECTED
Cocaine: NOT DETECTED
Opiates: NOT DETECTED
TETRAHYDROCANNABINOL: POSITIVE — AB

## 2015-10-05 LAB — ETHANOL: Alcohol, Ethyl (B): <5 mg/dL (ref <5)

## 2015-10-05 MED ORDER — AMPHETAMINE-DEXTROAMPHET ER 10 MG PO CP24: 30.0000 mg | ORAL_CAPSULE | Freq: Every day | ORAL | Status: DC

## 2015-10-05 MED ORDER — ALBUTEROL SULFATE HFA 108 (90 BASE) MCG/ACT IN AERS: 2.0000 | INHALATION_SPRAY | RESPIRATORY_TRACT | Status: DC | PRN

## 2015-10-05 NOTE — Progress Notes (Addendum)
[facility] inpatient criteria[facility]  Patient has been referred to the following hospitals: [facility] - per Mickeal SkinnerPhoebe, fax referral for review. [facility] - per Leotis ShamesLauren, has beds, fax it. [facility] - per Brad/Nancy, fax referral for review. [facility] - referral faxed for review. [facility] - per Joice, dc in am, fax referral. [facility] - per Arley PhenixAisha, reviewing adult referrals for tomorrow 12/28. Has couple adolescents tonight. [facility] - per intake, fax referral for the waitlist.  CSW will continue to seek placement.  Louis Scott, LCSWA Disposition staff 10/05/2015 10:57 PM
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
## 2015-10-05 NOTE — ED Provider Notes (Signed)
CSN: [id number]     Arrival date & time 10/05/15  1920 History   First MD Initiated Contact with Patient 10/05/15 1935     Chief Complaint  Patient presents with  . V70.1      HPI Pt was seen at 1950. Per Police, IVC paperwork and pt: Pt brought to the ED under IVC taken out by his mother. IVC states pt is "mentally ill," "doing prescription medication-pain pills, etc," "says things to make you believe he may be dying soon." States pt "does not sleep properly," "does not eat properly." Pt's "hygiene is bad," "he is a danger to himself," "is unstable," and "threatened to hurt other people." Pt states his mother "has done this stuff before because she just wants me to stop living with my grandmother." States he "has been with my grandmother all day and she'll tell you that all this isn't true." Denies HI, no SI, no hallucinations.     Past Medical History  Diagnosis Date  . Asthma   . Hallucination, visual   . Schizophrenic disorder (HCC)   . Polysubstance abuse    Past Surgical History  Procedure Laterality Date  . No past surgeries      Social History  Substance Use Topics  . Smoking status: Current Every Day Smoker -- 1.00 packs/day for 10 years    Types: Cigarettes  . Smokeless tobacco: None  . Alcohol Use: Yes     Comment: 6 pk beer weekly    Review of Systems ROS: Statement: All systems negative except as marked or noted in the HPI; Constitutional: Negative for fever and chills. ; ; Eyes: Negative for eye pain, redness and discharge. ; ; ENMT: Negative for ear pain, hoarseness, nasal congestion, sinus pressure and sore throat. ; ; Cardiovascular: Negative for chest pain, palpitations, diaphoresis, dyspnea and peripheral edema. ; ; Respiratory: Negative for cough, wheezing and stridor. ; ; Gastrointestinal: Negative for nausea, vomiting, diarrhea, abdominal pain, blood in stool, hematemesis, jaundice and rectal bleeding. . ; ; Genitourinary: Negative for dysuria, flank pain  and hematuria. ; ; Musculoskeletal: Negative for back pain and neck pain. Negative for swelling and trauma.; ; Skin: Negative for pruritus, rash, abrasions, blisters, bruising and skin lesion.; ; Neuro: Negative for headache, lightheadedness and neck stiffness. Negative for weakness, altered level of consciousness , altered mental status, extremity weakness, paresthesias, involuntary movement, seizure and syncope.; Psych:  No SI, no SA, no HI, no hallucinations.        Allergies  Review of patient's allergies indicates no known allergies.  Home Medications   Prior to Admission medications   Medication Sig Start Date End Date Taking? Authorizing Provider  albuterol (PROVENTIL HFA;VENTOLIN HFA) 108 (90 BASE) MCG/ACT inhaler Inhale 2 puffs into the lungs every 4 (four) hours as needed for wheezing or shortness of breath. 03/16/15 12/06/17  Sanjuana KavaAgnes I Nwoko, NP  FLUoxetine (PROZAC) 10 MG capsule Take 1 capsule (10 mg total) by mouth daily. For depression 03/16/15   Sanjuana KavaAgnes I Nwoko, NP  hydrOXYzine (ATARAX/VISTARIL) 50 MG tablet Take 1 tablet (50 mg total) by mouth at bedtime as needed for anxiety (Insomnia). Sleep 03/16/15   Sanjuana KavaAgnes I Nwoko, NP  nicotine polacrilex (NICORETTE) 2 MG gum Take 1 each (2 mg total) by mouth as needed for smoking cessation. 03/16/15   Sanjuana KavaAgnes I Nwoko, NP  ranitidine (ZANTAC) 150 MG tablet Take 1 tablet (150 mg total) by mouth 2 (two) times daily. For acid reflux 03/16/15   Sanjuana KavaAgnes I Nwoko, NP  BP 170/93 mmHg  Pulse 119  Temp(Src) 98.4 F (36.9 C) (Oral)  Resp 14  Ht  (1.626 m)  Wt 140 lb (63.504 kg)  BMI 24.02 kg/m2  SpO2 98% Physical Exam  1955: Physical examination:  Nursing notes reviewed; Vital signs and O2 SAT reviewed;  Constitutional: Well developed, Well nourished, Well hydrated, In no acute distress; Head:  Normocephalic, atraumatic; Eyes: EOMI, PERRL, No scleral icterus; ENMT: Mouth and pharynx normal, Mucous membranes moist; Neck: Supple, Full range of motion;  Cardiovascular: Regular rate and rhythm; Respiratory: Breath sounds clear. Speaking full sentences with ease, Normal respiratory effort/excursion; Chest: No deformity, Movement normal; Abdomen: Soft, Nondistended;; Extremities: Pulses normal, No tenderness, No edema, No calf edema or asymmetry.; Neuro: AA&Ox3, Major CN grossly intact.  Speech clear. No gross focal motor deficits in extremities. Climbs on and off stretcher easily by himself. Gait steady.; Skin: Color normal, Warm, Dry.; Psych:  Full affect. Denies SI. No overt psychosis.    ED Course  Procedures (including critical care time) Labs Review   Imaging Review  I have personally reviewed and evaluated these images and lab results as part of my medical decision-making.   EKG Interpretation None      MDM  MDM Reviewed: previous chart, nursing note and vitals Reviewed previous: labs Interpretation: labs     Results for orders placed or performed during the hospital encounter of 10/05/15  Comprehensive metabolic panel  Result Value Ref Range   Sodium 136 135 - 145 mmol/L   Potassium 3.5 3.5 - 5.1 mmol/L   Chloride 99 (L) 101 - 111 mmol/L   CO2 24 22 - 32 mmol/L   Glucose, Bld 91 65 - 99 mg/dL   BUN 12 6 - 20 mg/dL   Creatinine, Ser 1.61 0.61 - 1.24 mg/dL   Calcium 9.8 8.9 - 09.6 mg/dL   Total Protein 7.7 6.5 - 8.1 g/dL   Albumin 5.0 3.5 - 5.0 g/dL   AST 26 15 - 41 U/L   ALT 15 (L) 17 - 63 U/L   Alkaline Phosphatase 49 38 - 126 U/L   Total Bilirubin 0.8 0.3 - 1.2 mg/dL   GFR calc non Af Amer >60 >60 mL/min   GFR calc Af Amer >60 >60 mL/min   Anion gap 13 5 - 15  Ethanol  Result Value Ref Range   Alcohol, Ethyl (B) <5 <5 mg/dL  CBC with Diff  Result Value Ref Range   WBC 5.9 4.0 - 10.5 K/uL   RBC 4.80 4.22 - 5.81 MIL/uL   Hemoglobin 15.2 13.0 - 17.0 g/dL   HCT 04.5 40.9 - 81.1 %   MCV 85.8 78.0 - 100.0 fL   MCH 31.7 26.0 - 34.0 pg   MCHC 36.9 (H) 30.0 - 36.0 g/dL   RDW 91.4 78.2 - 95.6 %   Platelets 293  150 - 400 K/uL   Neutrophils Relative % 50 %   Neutro Abs 2.9 1.7 - 7.7 K/uL   Lymphocytes Relative 38 %   Lymphs Abs 2.3 0.7 - 4.0 K/uL   Monocytes Relative 8 %   Monocytes Absolute 0.5 0.1 - 1.0 K/uL   Eosinophils Relative 4 %   Eosinophils Absolute 0.2 0.0 - 0.7 K/uL   Basophils Relative 0 %   Basophils Absolute 0.0 0.0 - 0.1 K/uL    2145:  TTS eval pending. Holding orders written.     Samuel Jester, DO 10/05/15 2144

## 2015-10-05 NOTE — ED Notes (Signed)
TTS in progress 

## 2015-10-05 NOTE — ED Notes (Signed)
Brought in by the sheriff's department on IVC. Mother took out papers on him stating that he is mentally ill.

## 2015-10-05 NOTE — ED Notes (Signed)
Patient ambulatory to restroom without difficulty sitter present.

## 2015-10-06 DIAGNOSIS — F1994 Other psychoactive substance use, unspecified with psychoactive substance-induced mood disorder: Secondary | ICD-10-CM

## 2015-10-06 NOTE — ED Notes (Signed)
Patient cooperative and pleasant. [facility]charge instructions [facility] and other services in the community reviewed with patient. No questions. [facility], stated he has transportation. Patient given belongings and stated everything was accounted for. Patient left ED at this time.

## 2015-10-06 NOTE — ED Provider Notes (Signed)
Patient has been seen by psychiatry. He has been determined not to be a threat for himself or others. Discharge is recommended. Outpatient resources given. IVC rescinded.  BP 108/79 mmHg  Pulse 82  Temp(Src) 97.8 F (36.6 C) (Oral)  Resp 20  Ht 5\' 4"  (1.626 m)  Wt 140 lb (63.504 kg)  BMI 24.02 kg/m2  SpO2 97%   Glynn OctaveStephen Cherika Jessie, MD 10/06/15 1213

## 2015-10-06 NOTE — Consult Note (Signed)
Telepsych Consultation   Reason for Consult:  Reported drug abuse by mother who placed the patient under IVC Referring Physician:  [facility] EDP  Patient Identification: Louis Scott MRN:  [id number] Principal Diagnosis: Substance induced mood disorder [facility]) Diagnosis:   Patient Active Problem List   Diagnosis Date Noted  . MDD (major depressive disorder) ([place]) [F32.9] 03/16/2015  . Polysubstance dependence including opioid type drug, episodic abuse ([place]) [F11.20, F19.20] 03/14/2015  . Substance induced mood disorder ([place]) [F19.94] 03/14/2015  . Drug overdose, intentional ([place]) [T50.902A] 03/14/2015  . Schizophrenia, undifferentiated ([place]) [F20.3] 06/18/2012  . Cannabis abuse [F12.10] 06/18/2012    Total Time spent with patient: 30 minutes  Subjective:   Louis Scott is a 29 y.o. male patient admitted under IVC which was initiated by mother due to her concerns that the patient has been abusing pain killers, poor sleep, threats to hurt others. The patient denies all that is documented in the IVC stating "This is the third time my mother has done this. I have been doing well. It's just she came over the my grandmother's and we got in an argument. She does not want me living there. I'm not sure why because I have always helped care for my grandmother. I do not want to hurt myself. Yes I was at [facility] in June for taking too much motrin but I just wanted attention at that time. I have a new baby with my former girlfriend. I just want to get back to my life. I am willing to see an Psychiatrist or therapist outpatient and I live in [place]. I did not keep taking the Prozac that was prescribed at [facility] because I don't feel I need any help with depression."   HPI:    Per [facility] Assessment on 10/05/2015:  Louis Scott is an 29 y.o.single male who was brought to the [place] by members of the sheriff's department after his mother petitioned and obtained an IVC. Per records, IVC petition sts that  pt " is mentally ill" "is doing prescription medications-pain killers, etc." " says things to make you believe that he may be dying soon" " does not sleep properly" "does not eat properly" "hygiene is bad" " he is a danger to himself" "he is unstable" and " threatened to hurt other people." Pt sts that he lives with his maternal grandmother and his mother does not like it so, she has him involuntarily commited when she gets upset with him. Pt sts that his mother and aunt came by his grandmother's house And they got into an argument because they verbalized that he should leave. Pt sts that soon after his mother came back stating she wanted to take him out to eat and when he exited that residence, the police were wating to take him to the hospital. Pt denies SI, HI SHI and AVH. Pt has been previously diagnosed with schizophrenia, depression, anxiety disorder, polysubstance use and SI but, pt sts that "years ago" at an IP stay at [facility], he was told by doctors there that he had been previously misdiagnosed as having schizophrenia. Pt sts that doctors at [facility] told him that he had substance induced hallucinations because they only were present when he was using illegal substances. Pt sts that doctors at [facility] told him that he had substance induced mood disorder plus GAD. Pt sts he has never tried to kill himself although at times he does have SI. Pt sts he did accidentally OD once and was  hospitalized but, is was not a suicide attempt. Pt sts that he lives with his grandmother and helps her quite a bit since she has health problems. This Probation officer talked to his grandmother and she confirmed that her grandson is a help to her. GM sts that she does not believe that her grandson is a danger to himself or anyone else. GM sts that she is not aware of any hx of violence or aggression.   On evalution 10/06/2015:   Louis Scott was very engaged during the assessment and requesting to be discharged. He denies any  recent symptoms of depression, psychosis, homicidal ideation, mania, or suicidal ideation. Discussed voluntary admission due to information contained in IVC but patient is not interested in "coming back there." The patient denies any recent use of opiates. Admits to occasional marijuana use and recently borrowed and Adderall from a friend stating "I was getting it prescribed in [place] when I lived there but I don't have a Teacher, music here. Sometimes I need help to focus on tasks." His grandmother was contacted for collateral who supported the patient's story about chronic conflict with mother. Discussed case with Dr. Parke Poisson who was patient's attending Psychiatrist in June of 2016 on the [facility] [address]. There do not appear to be grounds to admit the patient at this time and the patient is willing to follow up outpatient potentially with family therapy to address relationship strain with mother. The patient is concerned that his mother will continue to unjustly place him under IVC.   HPI Elements:   Location:  depression, substance use. Quality:  Reports of poor self care and drug abuse by mother. Severity:  Moderate . Timing:  Acute. Duration:  Chronic conflict with mother. Context:  Family conflict, off psychiatric medication .  Past Medical History:  Past Medical History  Diagnosis Date  . Asthma   . Hallucination, visual   . Schizophrenic disorder ([place])   . Polysubstance abuse     Past Surgical History  Procedure Laterality Date  . No past surgeries     Family History: No family history on file. Social History:  History  Alcohol Use  . Yes    Comment: 6 pk beer weekly     History  Drug Use  . Yes  . Special: Marijuana, Cocaine    Comment: THC last use 3 weeks ago    Social History   Social History  . Marital Status: Single    Spouse Name: N/A  . Number of Children: N/A  . Years of Education: N/A   Social History Main Topics  . Smoking status: Current Every Day Smoker --  1.00 packs/day for 10 years    Types: Cigarettes  . Smokeless tobacco: None  . Alcohol Use: Yes     Comment: 6 pk beer weekly  . Drug Use: Yes    Special: Marijuana, Cocaine     Comment: THC last use 3 weeks ago  . Sexual Activity: Yes    Birth Control/ Protection: None   Other Topics Concern  . None   Social History Narrative   Additional Social History:    Prescriptions: See PTA list History of alcohol / drug use?: Yes Longest period of sobriety (when/how long): "don't know...don't use much now" Name of Substance 1: Cannabis 1 - Age of First Use: 13 1 - Amount (size/oz): 1 bowl 1 - Frequency: 2 x month 1 - Duration: ongoing 1 - Last Use / Amount: last week Name of Substance 2: Alcohol  2 - Age of First Use: 18 2 - Amount (size/oz): 6 pack 2 - Frequency: 1 x 6 months 2 - Duration: ongoing 2 - Last Use / Amount: 5-6 months ago                 Allergies:  No Known Allergies  Labs:  Results for orders placed or performed during the hospital encounter of 10/05/15 (from the past 48 hour(s))  Comprehensive metabolic panel     Status: Abnormal   Collection Time: 10/05/15  7:41 PM  Result Value Ref Range   Sodium 136 135 - 145 mmol/L   Potassium 3.5 3.5 - 5.1 mmol/L   Chloride 99 (L) 101 - 111 mmol/L   CO2 24 22 - 32 mmol/L   Glucose, Bld 91 65 - 99 mg/dL   BUN 12 6 - 20 mg/dL   Creatinine, Ser 1.02 0.61 - 1.24 mg/dL   Calcium 9.8 8.9 - 10.3 mg/dL   Total Protein 7.7 6.5 - 8.1 g/dL   Albumin 5.0 3.5 - 5.0 g/dL   AST 26 15 - 41 U/L   ALT 15 (L) 17 - 63 U/L   Alkaline Phosphatase 49 38 - 126 U/L   Total Bilirubin 0.8 0.3 - 1.2 mg/dL   GFR calc non Af Amer >60 >60 mL/min   GFR calc Af Amer >60 >60 mL/min    Comment: (NOTE) The eGFR has been calculated using the CKD EPI equation. This calculation has not been validated in all clinical situations. eGFR's persistently <60 mL/min signify possible Chronic Kidney Disease.    Anion gap 13 5 - 15  Ethanol      Status: None   Collection Time: 10/05/15  7:41 PM  Result Value Ref Range   Alcohol, Ethyl (B) <5 <5 mg/dL    Comment:        LOWEST DETECTABLE LIMIT FOR SERUM ALCOHOL IS 5 mg/dL FOR MEDICAL PURPOSES ONLY   CBC with Diff     Status: Abnormal   Collection Time: 10/05/15  7:41 PM  Result Value Ref Range   WBC 5.9 4.0 - 10.5 K/uL   RBC 4.80 4.22 - 5.81 MIL/uL   Hemoglobin 15.2 13.0 - 17.0 g/dL   HCT 41.2 39.0 - 52.0 %   MCV 85.8 78.0 - 100.0 fL   MCH 31.7 26.0 - 34.0 pg   MCHC 36.9 (H) 30.0 - 36.0 g/dL   RDW 11.8 11.5 - 15.5 %   Platelets 293 150 - 400 K/uL   Neutrophils Relative % 50 %   Neutro Abs 2.9 1.7 - 7.7 K/uL   Lymphocytes Relative 38 %   Lymphs Abs 2.3 0.7 - 4.0 K/uL   Monocytes Relative 8 %   Monocytes Absolute 0.5 0.1 - 1.0 K/uL   Eosinophils Relative 4 %   Eosinophils Absolute 0.2 0.0 - 0.7 K/uL   Basophils Relative 0 %   Basophils Absolute 0.0 0.0 - 0.1 K/uL  Urine rapid drug screen (hosp performed)not at [facility]     Status: Abnormal   Collection Time: 10/05/15 10:45 PM  Result Value Ref Range   Opiates NONE DETECTED NONE DETECTED   Cocaine NONE DETECTED NONE DETECTED   Benzodiazepines NONE DETECTED NONE DETECTED   Amphetamines POSITIVE (A) NONE DETECTED   Tetrahydrocannabinol POSITIVE (A) NONE DETECTED   Barbiturates NONE DETECTED NONE DETECTED    Comment:        DRUG SCREEN FOR MEDICAL PURPOSES ONLY.  IF CONFIRMATION IS NEEDED FOR ANY PURPOSE, NOTIFY  LAB WITHIN 5 DAYS.        LOWEST DETECTABLE LIMITS FOR URINE DRUG SCREEN Drug Class       Cutoff (ng/mL) Amphetamine      1000 Barbiturate      200 Benzodiazepine   765 Tricyclics       465 Opiates          300 Cocaine          300 THC              50     Vitals: Blood pressure 108/79, pulse 82, temperature 97.8 F (36.6 C), temperature source Oral, resp. rate 20, height _0  (1.626 m), weight 63.504 kg (140 lb), SpO2 97 %.  Risk to Self: Suicidal Ideation: No (denies) Suicidal Intent: No  (denies) Is patient at risk for suicide?: No (based on his stmts and talking to his GM) Suicidal Plan?: No (denies) Access to Means: No (denies) What has been your use of drugs/alcohol within the last 12 months?: occasional How many times?: 0 Other Self Harm Risks: none noted Triggers for Past Attempts:  (na) Intentional Self Injurious Behavior: None Risk to Others: Homicidal Ideation: No (denies) Thoughts of Harm to Others: No (denies) Current Homicidal Intent: No (denies) Current Homicidal Plan: No (denies) Access to Homicidal Means: No (denies) Identified Victim: na History of harm to others?: No (denies & GM confirms) Assessment of Violence: None Noted Violent Behavior Description: na Does patient have access to weapons?: No (denies) Criminal Charges Pending?: No (denies) Does patient have a court date: Yes (in January concerning child support) Prior Inpatient Therapy: Prior Inpatient Therapy: Yes Prior Therapy Dates: 2016, 2013 Prior Therapy Facilty/Provider(s): [facility] Reason for Treatment: Depression, Polysubstance use Prior Outpatient Therapy: Prior Outpatient Therapy: No Prior Therapy Dates: na Prior Therapy Facilty/Provider(s): na Reason for Treatment: na Does patient have an ACCT team?: No Does patient have Intensive In-House Services?  : No Does patient have [facility] services? : No Does patient have P4CC services?: No  Current Facility-Administered Medications  Medication Dose Route Frequency Provider Last Rate Last Dose  . albuterol (PROVENTIL HFA;VENTOLIN HFA) 108 (90 Base) MCG/ACT inhaler 2 puff  2 puff Inhalation Q4H PRN Francine Graven, DO      . amphetamine-dextroamphetamine (ADDERALL XR) 24 hr capsule 30 mg  30 mg Oral Daily Daleen Bo, MD       Current Outpatient Prescriptions  Medication Sig Dispense Refill  . albuterol (PROVENTIL HFA;VENTOLIN HFA) 108 (90 BASE) MCG/ACT inhaler Inhale 2 puffs into the lungs every 4 (four) hours as needed for  wheezing or shortness of breath. 18 g 0  . amphetamine-dextroamphetamine (ADDERALL XR) 30 MG 24 hr capsule Take 30 mg by mouth daily.    . Buprenorphine HCl-Naloxone HCl (SUBOXONE) 8-2 MG FILM Place 1 Film under the tongue daily.    Marland Kitchen FLUoxetine (PROZAC) 10 MG capsule Take 1 capsule (10 mg total) by mouth daily. For depression (Patient not taking: Reported on 10/05/2015) 30 capsule 0  . [DISCONTINUED] dicyclomine (BENTYL) 20 MG tablet Take 1 tablet (20 mg total) by mouth 3 (three) times daily before meals. As needed for abdominal pain (Patient not taking: Reported on 12/26/2014) 20 tablet 0    Musculoskeletal: Strength & Muscle Tone: within normal limits Gait & Station: normal Patient leans: N/A  Psychiatric Specialty Exam: Physical Exam  Review of Systems  Constitutional: Negative.   HENT: Negative.   Eyes: Negative.   Respiratory: Negative.   Cardiovascular: Negative.   Gastrointestinal:  Negative.   Genitourinary: Negative.   Musculoskeletal: Negative.   Skin: Negative.   Neurological: Negative.   Endo/Heme/Allergies: Negative.   Psychiatric/Behavioral: Positive for substance abuse. Negative for depression, suicidal ideas, hallucinations and memory loss. The patient is not nervous/anxious and does not have insomnia.     Blood pressure 108/79, pulse 82, temperature 97.8 F (36.6 C), temperature source Oral, resp. rate 20, height _0  (1.626 m), weight 63.504 kg (140 lb), SpO2 97 %.Body mass index is 24.02 kg/(m^2).  General Appearance: Casual  Eye Contact::  Good  Speech:  Clear and Coherent  Volume:  Normal  Mood:  Anxious  Affect:  Appropriate  Thought Process:  Goal Directed and Intact  Orientation:  Full (Time, Place, and Person)  Thought Content:  WDL  Suicidal Thoughts:  No  Homicidal Thoughts:  No  Memory:  Immediate;   Good Recent;   Good Remote;   Good  Judgement:  Fair  Insight:  Present  Psychomotor Activity:  Normal  Concentration:  Good  Recall:  Good   Fund of Knowledge:Good  Language: Good  Akathisia:  No  Handed:  Right  AIMS (if indicated):     Assets:  Communication Skills Desire for Improvement Housing Leisure Time Physical Health Resilience Social Support  ADL's:  Intact  Cognition: WNL  Sleep:      Medical Decision Making: Self-Limited or Minor (1), Review of Psycho-Social Stressors (1), Review or order clinical lab tests (1) and Review of Medication Regimen & Side Effects (2)  Plan:  No evidence of imminent risk to self or others at present.   Patient does not meet criteria for psychiatric inpatient admission. Supportive therapy provided about ongoing stressors. Discussed crisis plan, support from social network, calling 911, coming to the Emergency Department, and calling Suicide Hotline. Disposition: Discharge to home with outpatient resources for medication management and psychotherapy   Elmarie Shiley, NP-C 10/06/2015 10:33 AM   Agree with NP Assessment and Plan , as above

## 2015-10-06 NOTE — Progress Notes (Signed)
Discussed pt's case with psych team. Discharge has been recommended, and per NP pt expressed interest in finding therapy provider in area due to recent re-location.  CSW provided information for [facility] and [facility] in pt's d/c instructions.   Ilean SkillMeghan Kanna Dafoe, MSW, LCSW Clinical Social Work, Disposition  10/06/2015 [phone]

## 2015-10-06 NOTE — ED Notes (Signed)
Pharmacy delay on am meds.

## 2015-10-06 NOTE — Discharge Instructions (Signed)
Follow up with your therapist.  Return to the ED if you develop new or worsening sympto   Emergency Department Resource Guide 1) Find a Doctor and Pay Out of Pocket Although you won't have to find out who is covered by your insurance plan, it is a good idea to ask around and get recommendations. You will then need to call the office and see if the doctor you have chosen will accept you as a new patient and what types of options they offer for patients who are self-pay. Some doctors offer discounts or will set up payment plans for their patients who do not have insurance, but you will need to ask so you aren't surprised when you get to your appointment.  2) Contact Your Local Health Department Not all health departments have doctors that can see patients for sick visits, but many do, so it is worth a call to see if yours does. If you don't know where your local health department is, you can check in your phone book. The CDC also has a tool to help you locate your state's health department, and many state websites also have listings of all of their local health departments.  3) Find a Walk-in Clinic If your illness is not likely to be very severe or complicated, you may want to try a walk in clinic. These are popping up all over the country in pharmacies, drugstores, and shopping centers. They're usually staffed by nurse practitioners or physician assistants that have been trained to treat common illnesses and complaints. They're usually fairly quick and inexpensive. However, if you have serious medical issues or chronic medical problems, these are probably not your best option.  No Primary Care Doctor: - Call Health Connect at  [phone] - they can help you locate a primary care doctor that  accepts your insurance, provides certain services, etc. - Physician Referral Service- [phone]  Chronic Pain Problems: Organization         Address  Phone   Notes  [facility]  [phone]  [phone] Patients need to be referred by their primary care doctor.   Medication Assistance: Organization         Address  Phone   Notes  [facility] [address] [place], [place] [address] [phone]) [phone] --Must be a resident of [facility] -- Must have NO insurance coverage whatsoever (no Medicaid/ Medicare, etc.) -- The pt. MUST have a primary care doctor that directs their care regularly and follows them in the community   MedAssist  [phone]   [employer]  [phone]    Agencies that provide inexpensive medical care: Organization         Address  Phone   Notes  [facility]  [phone]) [phone]   [facility]    [phone]) [phone]   [facility] [address] [place], [place] [address] [phone]   [facility] [address], [place] [phone]   [facility]anned Parenthood    [phone]) [phone]   [facility]    [phone]   [facility]  [address], [place] Phone:  [phone]) [phone], Fax:  [phone] Hours of Operation:  9 am - 6 pm, M-F.  Also accepts Medicaid/Medicare and self-pay.  [facility]  [address], [place] Phone: [phone](336) [phone], Fax: [phone]) [phone]. Hours of  Operation:  8:30 am - 5:30 pm, M-F.  Also accepts Medicaid and self-pay.  [facility] [place] [address], [place] Phone: [phone]   [place], [place], [place] [phone], Ext. [phone] Mondays & Thursdays: 7-9 AM.  First 15 patients are seen on a first come, first serve basis.    [place] Providers:  Organization         Address  Phone   Notes  [facility] [address], [place] [phone] Also accepts self-pay patients.  [facility] [address], [place]   [phone]   [place], [address], [place] [phone]   [facility] [address], [place] [phone]   Lucianne Lei [address], [place]   [phone] Only accepts [place] Access [place] patients after they have their name applied to their card.   Self-Pay (no insurance) in [facility]:  Organization         Address  Phone   Notes  Sickle Cell Patients, [facility] Start [phone]   [facility] [place] [phone]   [facility] [place]  [place], [place], [place] [phone]   [facility]/Dr. Osei-Bonsu  [address] [place] or [address], [place] [phone] Phone number for both [place] and [place] locations is the same.  Urgent Medical [facility] [address] [place] [phone]   [facility] [address] [place] or [address] [phone]   [facility] [address], [place] [phone], phone; [phone], fax Sees patients 1st and 3rd Saturday of every month.  Must not qualify for public or private insurance (i.e. Medicaid, Medicare, [place] Health Choice, Veterans' Benefits)  Household income should be no more than 200% of the poverty level The clinic cannot treat you if you are pregnant or think you are pregnant  Sexually transmitted diseases are not treated at the clinic.    Dental Care: Organization         Address  Phone  Notes  [facility] of [facility] [phone] Accepts children up to age 67 who are enrolled in [place] or [place]; pregnant women with a Medicaid card; and children who have applied for Medicaid or [place] Health Choice, but  were declined, whose parents can pay a reduced fee at time of service.  [facility]  [address], [place] [phone] Accepts children up to age 45 who are enrolled in [place] or [place]; pregnant women with a Medicaid card; and children who have applied for Medicaid or [place] Health Choice, but were declined, whose parents can pay a reduced fee at time of service.  [place] Adult [facility] Access PROGRAM  [place] [phone] Patients are seen by appointment only. Walk-ins are not accepted. [place] will see patients 9 years of age and older. Monday - Tuesday (8am-5pm) Most Wednesdays (8:30-5pm) $30 per visit, cash only  [facility] Access PROGRAM  [address], [facility] [phone] Patients are seen  by appointment only. Walk-ins are not accepted. [place] will see patients 2 years of age and older. One Wednesday Evening (Monthly: Volunteer Based).  $30 per visit, cash only  [place]  [phone] for adults; Children under age 54, call Graduate Pediatric Dentistry at [phone]. Children aged 40-14, please call [phone] to request a pediatric application.  Dental services are provided in all areas of dental care including fillings, crowns and bridges, complete and partial dentures, implants, gum treatment, root canals, and extractions. Preventive care is also provided. Treatment is provided to both adults and children. Patients are selected via a lottery and there is often a waiting list.   [facility] [address], [place]  [phone] [email or web address]   [facility] [address] [place], [place] [phone], Ext. [phone] Second and Fourth Thursday of each month, opens at 6:30 AM; Clinic ends at 9 AM.  Patients are seen on a first-come first-served basis, and a limited number are seen during each clinic.    [facility]  [address] [place] D[place]er [place], [place] [phone]   Eligibility Requirements You must have lived in [place], [place], or [place] counties for at least the last three months.   You cannot be eligible for state or federal sponsored [employer], including [employer], [place], or [employer].   You generally cannot be eligible for healthcare insurance through your employer.    How to apply: Eligibility screenings are held every Tuesday and Wednesday afternoon from 1:00 pm until 4:00 pm. You do not need an appointment for the interview!  [facility] [address] [place], [place]   [place]  [place] Department  [place]  [phone]    Behavioral Health Resources in the Community: Intensive Outpatient Programs Organization         Address  Phone  Notes  [place]. [address] [place], [place] [phone]   [facility] Outpatient [address], [place], [place]   ADS: Alcohol & Drug Svcs [address], [place], [place]   [place] [address]  [place], [place] or [phone]   Substance Abuse Resources Organization         Address  Phone  Notes  Alcohol and Drug Services  [phone]   [place]  [phone]   [facility] [place]   Chinita Pester  [phone]   Residential & Outpatient Substance Abuse Program  [phone]   Psychological Services Organization         Address  Phone  Notes  [facility] [place]  [place]  [phone]   [place] [address], [place] or [phone]    Mobile Crisis Teams Organization         Address  Phone  Notes  [facility], Mobile Crisis Care  Unit  [phone]   [facility] Psychotherapeutic Services  [address] [place], [place]   [place] Levels [address] [place] [phone]    Self-Help/Support Groups Organization         Address  Phone             Notes  [place]. of Time - variety of support groups  [place] Call for more  information  Narcotics Anonymous (NA), [facility]aring [facility] [address], [place]  2 meetings at this location   Residential Facilities manager         Address  Phone  Notes  [facility] [place],    Page  [phone]   [facility]  [address], [place] [phone], [place], [place]   [place], [place] [phone] Admissions: 8am-3pm M-F  Incentives Substance [place] [address],    [place], [place] [phone]   [facility] [address] [place], [place], [place]   [facility] [address]  [place], [place]   [facility] - Intensive Outpatient [address], [place], [place]   [facility] ([place].) [place].,  [place], [place] [phone] or [phone]   [facility]idential Treatment Services (RTS) [address], [place], [place] Accepts Medicaid  [facility] [place] [address],  [place] [phone] Substance Abuse/Addiction Treatment   [facility] Organization         Address  Phone  Notes  [facility]  [phone]   Domenic Schwab, PhD [address] Arlis Porta [place], [place]   [phone] or [phone]   [place], [place] [phone]   [facility] [address] [place], [place] [phone] Insurance/Medicaid/sponsorship through [facility] and Families [address]                                     [place], [place] [phone] [place] [address], [place] [phone]    Dr. Adele Schilder  [phone]   [facility] Dept. 1) [address] [place] 2) [place] 3)  [place] [address], [place] [phone]  [phone]   [place] [phone] or [phone] (After Hours)

## 2015-10-12 ENCOUNTER — Encounter (HOSPITAL_COMMUNITY): Payer: Self-pay

## 2015-10-12 ENCOUNTER — Emergency Department (HOSPITAL_COMMUNITY)
Admission: EM | Admit: 2015-10-12 | Discharge: 2015-10-13 | Disposition: A | Discharge status: Home / Self Care | Payer: Self-pay | Attending: Emergency Medicine | Admitting: Emergency Medicine

## 2015-10-12 DIAGNOSIS — Z79899 Other long term (current) drug therapy: Secondary | ICD-10-CM | POA: Insufficient documentation

## 2015-10-12 DIAGNOSIS — F1910 Other psychoactive substance abuse, uncomplicated: Secondary | ICD-10-CM

## 2015-10-12 DIAGNOSIS — J45909 Unspecified asthma, uncomplicated: Secondary | ICD-10-CM | POA: Insufficient documentation

## 2015-10-12 DIAGNOSIS — F1110 Opioid abuse, uncomplicated: Secondary | ICD-10-CM | POA: Insufficient documentation

## 2015-10-12 DIAGNOSIS — F17210 Nicotine dependence, cigarettes, uncomplicated: Secondary | ICD-10-CM | POA: Insufficient documentation

## 2015-10-12 DIAGNOSIS — F1210 Cannabis abuse, uncomplicated: Secondary | ICD-10-CM | POA: Insufficient documentation

## 2015-10-12 NOTE — ED Notes (Signed)
Patient arrives IVD'd by mother with [employer]. Stating poly substance abuse, threats of killing self, and hearing voices.

## 2015-10-12 NOTE — ED Provider Notes (Signed)
CSN: [id number]     Arrival date & time 10/12/15  2343 History  By signing my name below, I, Louis Scott, attest that this documentation has been prepared under the direction and in the presence of Devoria Albe, MD at 0002. Electronically Signed: Doreatha Scott, ED Scribe. 10/12/2015. 12:17 AM.    Chief Complaint  Patient presents with  . V70.1   The history is provided by the patient. No language interpreter was used.    HPI Comments: Louis Scott is a 30 y.o. male with h/o visual hallucinations, schizophrenic disorder, polysubstance abuse, anxiety who presents to the Emergency Department IVC at his mother's request. Pt states he lives with his grandmother and was at her house this evening, then walked to his mothers house in order to ask for a ride to a friend's house. He states he was at her home for 2 minutes before the police arrived. He reports that he does not know why he is here. Pt is not medicated and is beginning a new job 10/24/15. Pt was last seen IVC in the ED on 10/05/15 due to concern from his mother and was not admitted to his hospital. He states he has not been able to be [facility] at [facility] because his mother will not take him and he was unable to find a ride. He reports that his family does not want him to live with his grandmother. He only states "on 67 years old and a think I should have a job and they are right" He denies any symptoms, SI or HI. Per police report, mother says he has schizophrenic with bipolar disorder and reported hearing voices in the past. She reports he is using controlled substances and repeatedly calls his her, wanting money. She reported he has been walking in the rain for the past 3 days, not eating or sleeping or caring for himself. Per mother, he stated he plans to kill himself when he gets the nerve.   PCP- Dr. Gerda Diss   Past Medical History  Diagnosis Date  . Asthma   . Hallucination, visual   . Schizophrenic disorder (HCC)   . Polysubstance abuse     Past Surgical History  Procedure Laterality Date  . No past surgeries     History reviewed. No pertinent family history. Social History  Substance Use Topics  . Smoking status: Current Every Day Smoker -- 1.00 packs/day for 10 years    Types: Cigarettes  . Smokeless tobacco: None  . Alcohol Use: Yes     Comment: 6 pk beer weekly   lives with grandmother Unemployed  Review of Systems  All other systems reviewed and are negative.  Allergies  Review of patient's allergies indicates no known allergies.  Home Medications   Prior to Admission medications   Medication Sig Start Date End Date Taking? Authorizing Provider  albuterol (PROVENTIL HFA;VENTOLIN HFA) 108 (90 BASE) MCG/ACT inhaler Inhale 2 puffs into the lungs every 4 (four) hours as needed for wheezing or shortness of breath. 03/16/15 12/06/17  Sanjuana Kava, NP  amphetamine-dextroamphetamine (ADDERALL XR) 30 MG 24 hr capsule Take 30 mg by mouth daily.    Historical Provider, MD  Buprenorphine HCl-Naloxone HCl (SUBOXONE) 8-2 MG FILM Place 1 Film under the tongue daily.    Historical Provider, MD  FLUoxetine (PROZAC) 10 MG capsule Take 1 capsule (10 mg total) by mouth daily. For depression Patient not taking: Reported on 10/05/2015 03/16/15   Sanjuana Kava, NP   BP 134/84 mmHg  Pulse 104  Temp(Src) 98.7 F (37.1 C) (Oral)  Resp 18  Ht 5\' 3"  (1.6 m)  Wt 140 lb (63.504 kg)  BMI 24.81 kg/m2  SpO2 100%  Vital signs normal except for tachycardia  Physical Exam  Constitutional: He is oriented to person, place, and time. He appears well-developed and well-nourished.  Non-toxic appearance. He does not appear ill. No distress.  Able to hold conversation.   HENT:  Head: Normocephalic and atraumatic.  Right Ear: External ear normal.  Left Ear: External ear normal.  Nose: Nose normal. No mucosal edema or rhinorrhea.  Mouth/Throat: Oropharynx is clear and moist and mucous membranes are normal. No dental abscesses or uvula  swelling.  Eyes: Conjunctivae and EOM are normal. Pupils are equal, round, and reactive to light.  Neck: Normal range of motion and full passive range of motion without pain. Neck supple.  Cardiovascular: Normal rate, regular rhythm and normal heart sounds.  Exam reveals no gallop and no friction rub.   No murmur heard. Pulmonary/Chest: Effort normal and breath sounds normal. No respiratory distress. He has no wheezes. He has no rhonchi. He has no rales. He exhibits no tenderness and no crepitus.  Abdominal: Soft. Normal appearance and bowel sounds are normal. He exhibits no distension. There is no tenderness. There is no rebound and no guarding.  Musculoskeletal: Normal range of motion. He exhibits no edema or tenderness.  Moves all extremities well.   Neurological: He is alert and oriented to person, place, and time. He has normal strength. No cranial nerve deficit.  Skin: Skin is warm, dry and intact. No rash noted. No erythema. No pallor.  Psychiatric: He has a normal mood and affect. His speech is normal and behavior is normal. His mood appears not anxious.  Nursing note and vitals reviewed.   ED Course  Procedures (including critical care time)  Medications  acetaminophen (TYLENOL) tablet 650 mg (not administered)  ibuprofen (ADVIL,MOTRIN) tablet 600 mg (not administered)  zolpidem (AMBIEN) tablet 10 mg (not administered)  nicotine (NICODERM CQ - dosed in mg/24 hours) patch 21 mg (not administered)  ondansetron (ZOFRAN) tablet 4 mg (not administered)  alum & mag hydroxide-simeth (MAALOX/MYLANTA) 200-200-20 MG/5ML suspension 30 mL (not administered)  albuterol (PROVENTIL HFA;VENTOLIN HFA) 108 (90 Base) MCG/ACT inhaler 2 puff (2 puffs Inhalation Given 10/13/15 0535)    DIAGNOSTIC STUDIES: Oxygen Saturation is 100% on RA, normal by my interpretation.    COORDINATION OF CARE: 12:14 AM Discussed treatment plan with pt at bedside and pt agreed to plan.  After his laboratory test  resulted he was placed on psych hold. Patient is waiting TSS consult.   Labs Review Results for orders placed or performed during the hospital encounter of 10/12/15  Urine rapid drug screen (hosp performed)  Result Value Ref Range   Opiates POSITIVE (A) NONE DETECTED   Cocaine NONE DETECTED NONE DETECTED   Benzodiazepines NONE DETECTED NONE DETECTED   Amphetamines NONE DETECTED NONE DETECTED   Tetrahydrocannabinol POSITIVE (A) NONE DETECTED   Barbiturates NONE DETECTED NONE DETECTED  Ethanol  Result Value Ref Range   Alcohol, Ethyl (B) <5 <5 mg/dL  Comprehensive metabolic panel  Result Value Ref Range   Sodium 138 135 - 145 mmol/L   Potassium 4.3 3.5 - 5.1 mmol/L   Chloride 103 101 - 111 mmol/L   CO2 28 22 - 32 mmol/L   Glucose, Bld 101 (H) 65 - 99 mg/dL   BUN 15 6 - 20 mg/dL   Creatinine, Ser 0.450.94  0.61 - 1.24 mg/dL   Calcium 9.7 8.9 - 16.1 mg/dL   Total Protein 7.0 6.5 - 8.1 g/dL   Albumin 4.4 3.5 - 5.0 g/dL   AST 24 15 - 41 U/L   ALT 16 (L) 17 - 63 U/L   Alkaline Phosphatase 49 38 - 126 U/L   Total Bilirubin 0.6 0.3 - 1.2 mg/dL   GFR calc non Af Amer >60 >60 mL/min   GFR calc Af Amer >60 >60 mL/min   Anion gap 7 5 - 15  CBC with Differential  Result Value Ref Range   WBC 8.6 4.0 - 10.5 K/uL   RBC 4.70 4.22 - 5.81 MIL/uL   Hemoglobin 15.2 13.0 - 17.0 g/dL   HCT 09.6 04.5 - 40.9 %   MCV 89.4 78.0 - 100.0 fL   MCH 32.3 26.0 - 34.0 pg   MCHC 36.2 (H) 30.0 - 36.0 g/dL   RDW 81.1 91.4 - 78.2 %   Platelets 295 150 - 400 K/uL   Neutrophils Relative % 64 %   Neutro Abs 5.5 1.7 - 7.7 K/uL   Lymphocytes Relative 24 %   Lymphs Abs 2.1 0.7 - 4.0 K/uL   Monocytes Relative 7 %   Monocytes Absolute 0.6 0.1 - 1.0 K/uL   Eosinophils Relative 5 %   Eosinophils Absolute 0.4 0.0 - 0.7 K/uL   Basophils Relative 0 %   Basophils Absolute 0.0 0.0 - 0.1 K/uL   Laboratory interpretation all normal except +UDS     I have personally reviewed and evaluated these lab results as part  of my medical decision-making.   MDM   Final diagnoses:  Polysubstance abuse   Disposition pending TSS evaluation  I personally performed the services described in this documentation, which was scribed in my presence. The recorded information has been reviewed and considered.  Devoria Albe, MD, Concha Pyo, MD 10/13/15 [phone]

## 2015-10-13 LAB — RAPID URINE DRUG SCREEN, HOSP PERFORMED
Amphetamines: NOT DETECTED
Barbiturates: NOT DETECTED
Benzodiazepines: NOT DETECTED
COCAINE: NOT DETECTED
Opiates: POSITIVE — AB
Tetrahydrocannabinol: POSITIVE — AB

## 2015-10-13 LAB — CBC WITH DIFFERENTIAL/PLATELET
Basophils Absolute: 0 10*3/uL (ref 0.0–0.1)
Basophils Relative: 0 %
EOS PCT: 5 %
Eosinophils Absolute: 0.4 10*3/uL (ref 0.0–0.7)
HEMATOCRIT: 42 % (ref 39.0–52.0)
Hemoglobin: 15.2 g/dL (ref 13.0–17.0)
Lymphocytes Relative: 24 %
Lymphs Abs: 2.1 10*3/uL (ref 0.7–4.0)
MCH: 32.3 pg (ref 26.0–34.0)
MCHC: 36.2 g/dL — ABNORMAL HIGH (ref 30.0–36.0)
MCV: 89.4 fL (ref 78.0–100.0)
MONO ABS: 0.6 10*3/uL (ref 0.1–1.0)
Monocytes Relative: 7 %
Neutro Abs: 5.5 10*3/uL (ref 1.7–7.7)
Neutrophils Relative %: 64 %
PLATELETS: 295 10*3/uL (ref 150–400)
RBC: 4.7 MIL/uL (ref 4.22–5.81)
RDW: 12.1 % (ref 11.5–15.5)
WBC: 8.6 10*3/uL (ref 4.0–10.5)

## 2015-10-13 LAB — COMPREHENSIVE METABOLIC PANEL
ALBUMIN: 4.4 g/dL (ref 3.5–5.0)
ALT: 16 U/L — ABNORMAL LOW (ref 17–63)
AST: 24 U/L (ref 15–41)
Alkaline Phosphatase: 49 U/L (ref 38–126)
Anion gap: 7 (ref 5–15)
BUN: 15 mg/dL (ref 6–20)
CO2: 28 mmol/L (ref 22–32)
Calcium: 9.7 mg/dL (ref 8.9–10.3)
Chloride: 103 mmol/L (ref 101–111)
Creatinine, Ser: 0.94 mg/dL (ref 0.61–1.24)
GFR calc Af Amer: >60 mL/min (ref >60)
GFR calc non Af Amer: >60 mL/min (ref >60)
GLUCOSE: 101 mg/dL — AB (ref 65–99)
POTASSIUM: 4.3 mmol/L (ref 3.5–5.1)
SODIUM: 138 mmol/L (ref 135–145)
Total Bilirubin: 0.6 mg/dL (ref 0.3–1.2)
Total Protein: 7 g/dL (ref 6.5–8.1)

## 2015-10-13 LAB — ETHANOL: Alcohol, Ethyl (B): <5 mg/dL (ref <5)

## 2015-10-13 MED ORDER — IBUPROFEN 400 MG PO TABS: 600.0000 mg | ORAL_TABLET | Freq: Three times a day (TID) | ORAL | Status: DC | PRN

## 2015-10-13 MED ORDER — ACETAMINOPHEN 325 MG PO TABS: 650.0000 mg | ORAL_TABLET | ORAL | Status: DC | PRN

## 2015-10-13 MED ORDER — ALUM & MAG HYDROXIDE-SIMETH 200-200-20 MG/5ML PO SUSP: 30.0000 mL | ORAL | Status: DC | PRN

## 2015-10-13 MED ORDER — ONDANSETRON HCL 4 MG PO TABS: 4.0000 mg | ORAL_TABLET | Freq: Three times a day (TID) | ORAL | Status: DC | PRN

## 2015-10-13 MED ORDER — NICOTINE 21 MG/24HR TD PT24: 21.0000 mg | MEDICATED_PATCH | Freq: Every day | TRANSDERMAL | Status: DC

## 2015-10-13 MED ORDER — ZOLPIDEM TARTRATE 5 MG PO TABS: 10.0000 mg | ORAL_TABLET | Freq: Every evening | ORAL | Status: DC | PRN

## 2015-10-13 MED ORDER — ALBUTEROL SULFATE HFA 108 (90 BASE) MCG/ACT IN AERS
2.0000 | INHALATION_SPRAY | Freq: Four times a day (QID) | RESPIRATORY_TRACT | Status: DC | PRN
  Administered 2015-10-13: 2 via RESPIRATORY_TRACT
  Filled 2015-10-13: qty 6.7

## 2015-10-13 NOTE — ED Notes (Signed)
Patient back in bed, metal restraint in place to left ankle by [employer]. [facility] remains at bedside.

## 2015-10-13 NOTE — ED Notes (Signed)
Patient changed into paper scrubs at this time, belongings placed in personal belongings bag and secured into locker. Patient wanded by security at this time

## 2015-10-13 NOTE — ED Notes (Signed)
Patient ambulatory to restroom to collect urine sample. [employer] remains with patient.

## 2015-10-13 NOTE — ED Notes (Signed)
Water and gram crackers provided at patient request

## 2015-10-13 NOTE — Discharge Instructions (Signed)
Polysubstance Abuse °When people abuse more than one drug or type of drug it is called polysubstance or polydrug abuse. For example, many smokers also drink alcohol. This is one form of polydrug abuse. Polydrug abuse also refers to the use of a drug to counteract an unpleasant effect produced by another drug. It may also be used to help with withdrawal from another drug. People who take stimulants may become agitated. Sometimes this agitation is countered with a tranquilizer. This helps protect against the unpleasant side effects. Polydrug abuse also refers to the use of different drugs at the same time.  °Anytime drug use is interfering with normal living activities, it has become abuse. This includes problems with family and friends. Psychological dependence has developed when your mind tells you that the drug is needed. This is usually followed by physical dependence which has developed when continuing increases of drug are required to get the same feeling or "high". This is known as addiction or chemical dependency. A person's risk is much higher if there is a history of chemical dependency in the family. °SIGNS OF CHEMICAL DEPENDENCY °· You have been told by friends or family that drugs have become a problem. °· You fight when using drugs. °· You are having blackouts (not remembering what you do while using). °· You feel sick from using drugs but continue using. °· You lie about use or amounts of drugs (chemicals) used. °· You need chemicals to get you going. °· You are suffering in work performance or in school because of drug use. °· You get sick from use of drugs but continue to use anyway. °· You need drugs to relate to people or feel comfortable in social situations. °· You use drugs to forget problems. °"Yes" answered to any of the above signs of chemical dependency indicates there are problems. The longer the use of drugs continues, the greater the problems will become. °If there is a family history of  drug or alcohol use, it is best not to experiment with these drugs. Continual use leads to tolerance. After tolerance develops more of the drug is needed to get the same feeling. This is followed by addiction. With addiction, drugs become the most important part of life. It becomes more important to take drugs than participate in the other usual activities of life. This includes relating to friends and family. Addiction is followed by dependency. Dependency is a condition where drugs are now needed not just to get high, but to feel normal. °Addiction cannot be cured but it can be stopped. This often requires outside help and the care of professionals. Treatment centers are listed in the yellow pages under: Cocaine, Narcotics, and Alcoholics Anonymous. Most hospitals and clinics can refer you to a specialized care center. Talk to your caregiver if you need help. °  °This information is not intended to replace advice given to you by your health care provider. Make sure you discuss any questions you have with your health care provider. °  °Document Released: 05/17/2005 Document Revised: 12/18/2011 Document Reviewed: 09/30/2014 °Elsevier Interactive Patient Education ©2016 Elsevier Inc. ° ° ° °Emergency Department Resource Guide °1) Find a Doctor and Pay Out of Pocket °Although you won't have to find out who is covered by your insurance plan, it is a good idea to ask around and get recommendations. You will then need to call the office and see if the doctor you have chosen will accept you as a new patient and what types of options   they offer for patients who are self-pay. Some doctors offer discounts or will set up payment plans for their patients who do not have insurance, but you will need to ask so you aren't surprised when you get to your appointment. ° °2) Contact Your Local Health Department °Not all health departments have doctors that can see patients for sick visits, but many do, so it is worth a call to see if  yours does. If you don't know where your local health department is, you can check in your phone book. The CDC also has a tool to help you locate your state's health department, and many state websites also have listings of all of their local health departments. ° °3) Find a Walk-in Clinic °If your illness is not likely to be very severe or complicated, you may want to try a walk in clinic. These are popping up all over the country in pharmacies, drugstores, and shopping centers. They're usually staffed by nurse practitioners or physician assistants that have been trained to treat common illnesses and complaints. They're usually fairly quick and inexpensive. However, if you have serious medical issues or chronic medical problems, these are probably not your best option. ° °No Primary Care Doctor: °- Call [facility] Connect at  832-8000 - they can help you locate a primary care doctor that  accepts your insurance, provides certain services, etc. °- Physician Referral Service- 1-800-533-3463 ° °Chronic Pain Problems: °Organization         Address  Phone   Notes  °[facility]  (336) 297-2271 Patients need to be referred by their primary care doctor.  ° °Medication Assistance: °Organization         Address  Phone   Notes  °[place] [facility] [address] °[place], [place] [address] (336) 641-8030 --Must be a resident of [place] °-- Must have NO insurance coverage whatsoever (no Medicaid/ Medicare, etc.) °-- The pt. MUST have a primary care doctor that directs their care regularly and follows them in the community °  °[facility]  (866) 331-1348   °[employer]  (888) 892-1162   ° °Agencies that provide inexpensive medical care: °Organization         Address  Phone   Notes  °[facility]  (336) 832-8035   °[facility]    (336) 832-7272   °[facility] [address] °[place], [place] [address] (336) 832-4777    °[facility] [address], °[place] (336) 271-4999   °[facility]    (336) 373-0678   °[facility]    (336) 272-1050   °[facility] ° [address], [place] Phone:  (336) 832-4444, Fax:  (336) 832-4440 Hours of Operation:  9 am - 6 pm, M-F.  Also accepts Medicaid/Medicare and self-pay.  °[facility] ° [address], [place] Phone: (336) 832-3150, Fax: (336) 832-3151. Hours of Operation:  8:30 am - 5:30 pm, M-F.  Also accepts Medicaid and self-pay.  °[facility] [place] [address], [place] Phone: (336) 878-6027   °[facility] [address], [place],  (336)723-1848, Ext. 123 Mondays & Thursdays: 7-9 AM.  First 15 patients are seen on a first come, first serve basis. °  ° °Medicaid-accepting [place] Providers: ° °Organization         Address  Phone   Notes  °[facility] [address], [place] (336) 641-2100 Also accepts self-pay patients.  °[facility] [address], [place] ° (336) 856-9996   °[facility] [address], [place] (336) 288-8857   °[facility] [address] [place] [address], [place] (336) 299-7000   °Veita Bland [address], [place]  ° (336) 373-1557 Only accepts [place] Access Medicaid patients after they have their name applied to their card.  ° °Self-Pay (no insurance) in [place]: ° °Organization         Address  Phone   Notes  °Sickle Cell Patients, [facility] [address], [place] (336) 832-1970   °[facility] [address], [place] (336) 832-4400   ° [facility] ° [address], [place] (336) 992-4800   °[facility]/Dr. Osei-Bonsu ° [address] [place] [address], [place] or [address], [place] (336)  841-8500 Phone number for both [place] and [place] locations is the same.  °[facility] [address], [place] (336) 299-0000   °[facility] [address] [place] [address], [place] or [address] (336) 852-7530 °(336) 878-2260   °[facility] [address], [place] (336) 350-1642, phone; (336) 294-5005, fax Sees patients 1st and 3rd Saturday of every month.  Must not qualify for public or private insurance (i.e. Medicaid, Medicare, [place] Health Choice, Veterans' Benefits) • Household income should be no more than 200% of the poverty level •The clinic cannot treat you if you are pregnant or think you are pregnant • Sexually transmitted diseases are not treated at the clinic.  ° ° °Dental Care: °Organization         Address  Phone  Notes  °[facility] [facility] [address], [place] (336) 641-6152 Accepts children up to age 21 who are enrolled in Medicaid or [place] Health Choice; pregnant women with a Medicaid card; and children who have applied for Medicaid or [place] Health Choice, but were declined, whose parents can pay a reduced fee at time of service.  °[facility] [place]  [address], [place] (336) 641-7733 Accepts children up to age 21 who are enrolled in Medicaid or [place] Health Choice; pregnant women with a Medicaid card; and children who have applied for Medicaid or [place] Health Choice, but were declined, whose parents can pay a reduced fee at time of service.  °[facility] ° [address], [place] (336) 641-4533 Patients are seen by appointment only. Walk-ins are not accepted. [facility] will see patients 18 years of age and older. °Monday - Tuesday (8am-5pm) °Most Wednesdays (8:30-5pm) °$30 per visit, cash only  °[facility] ° [address], [place] (336) 641-4533 Patients are seen by  appointment only. Walk-ins are not accepted. [facility] will see patients 18 years of age and older. °One Wednesday Evening (Monthly: Volunteer Based).  $30 per visit, cash only  °[facility]  (919) 537-3737 for adults; Children under age 4, call Graduate Pediatric Dentistry at (919) [phone]. Children aged 4-14, please call (919) [phone] to request a pediatric application. ° Dental services are provided in all areas of dental care including fillings, crowns and bridges, complete and partial dentures,   implants, gum treatment, root canals, and extractions. Preventive care is also provided. Treatment is provided to both adults and children. °Patients are selected via a lottery and there is often a waiting list. °  °[facility] [address], °[place] ° (336) 763-8833 [email or web address] °  °[facility] [address], [place], [place] (336)723-1848, Ext. 123 Second and Fourth Thursday of each month, opens at 6:30 AM; Clinic ends at 9 AM.  Patients are seen on a first-come first-served basis, and a limited number are seen during each clinic.  ° °[facility] ° [address], [place], [place] (336) 723-7904   Eligibility Requirements °You must have lived in [place], or [place] counties for at least the last three months. °  You cannot be eligible for state or federal sponsored healthcare insurance, including Veterans Administration, Medicaid, or Medicare. °  You generally cannot be eligible for healthcare insurance through your employer.  °  How to apply: °Eligibility screenings are held every Tuesday and Wednesday afternoon from 1:00 pm until 4:00 pm. You do not need an appointment for the interview!  °[facility] [address], [place], [place] 336-631-2330   °[facility]  336-342-8273   °[facility]  336-703-3100   °[facility]  336-570-6415    ° °Behavioral Health Resources in the Community: °Intensive Outpatient Programs °Organization         Address  Phone  Notes  °[facility] [address], [place], [place] 336-878-6098   °[facility] [address], [place], [place] 336-832-9800   °[facility] [address], [place], [place] ° 336-882-2125   °[facility] [address],  °[place], [place] 1-800-853-5163 or 336-641-4981   °Substance Abuse Resources °Organization         Address  Phone  Notes  °[facility]  336-882-2125   °[facility]  336-784-9470   °[facility]  336-285-9073   °[facility]  336-845-3988   °Residential & Outpatient Substance Abuse Program  1-800-659-3381   °Psychological Services °Organization         Address  Phone  Notes  °[facility]  336- 832-9600   °[facility]  336- 378-7881   °[facility] [address], [place] 1-800-853-5163 or 336-641-4981   ° °Mobile Crisis Teams °Organization         Address  Phone  Notes  °[facility], Mobile Crisis Care Unit  1-877-626-1772   °Assertive °[facility] ° [address] [place], [place] 336-834-9664   °Sharon DeEsch [address] °[place] 336-554-5454   ° °Self-Help/Support Groups °Organization         Address  Phone             Notes  °[facility] [place] - variety of support groups  336- 373-1402 Call for more information  °Narcotics Anonymous (NA), [facility] [address], °[place]  2 meetings at this location  ° °Residential Treatment Programs °Organization         Address  Phone  Notes  °[facility] [address],    °[place]  1-866-801-8205   °[facility] ° [address], [place], [place] 704-293-8524   °[facility] [address], [place] 336-845-3988 Admissions: 8am-3pm M-F  °Incent[facility]    [facility] [address]    °[place], [place] 336-841-1104   °[facility] [address] [address], [place], [place] 336-379-7146   °[facility] [address]  °[place], [place] 336-285-9073   °[facility] Intensive Outpatient [address], [place], [place] 336-852-3033   °[facility] [address]  °[place], [place] 1-877-615-2722 or 336-784-9470   °[facility] ([facility] [address] [place], [place] 336-227-7417 Accepts Medicaid  °[facility] [address]  ° [place] 1-800-659-3381 Substance Abuse/Addiction Treatment  ° °[place] Behavioral Health Resources °Organization         Address  Phone  Notes  °[facility]  (888) 581-9988   °Julie Brannon, PhD [address] [place], [place]   (336) 349-5553 or (336) 951-0000   °[facility]   [address] °[place], [place] (336) 349-4454   °[facility] [address], [place], [place] (336) 342-8316 Insurance/Medicaid/sponsorship through [facility]  °[facility] [address]                                    [place], [place] (336) 342-8316 Therapy/tele-psych/case  °[facility] [address]  ° [place], [place] (336) 349-2233    °Dr. Arfeen  (336) 349-4544   °[facility] [place]  [employer] [place] [facility] 1) [address], [place] °2) [address], [place] °3)  [address], [place] (336) 349-3220 °(336) 342-7768 ° °(336) 342-8140   °[place] [facility] (336) 342-1394 or (336) 342-3537 (After Hours)    ° °\ ° °

## 2015-10-13 NOTE — BH Assessment (Addendum)
Tele Assessment Note   Louis Scott is an 30 y.o. male. Pt was IVCd by his mother. Per Pt's mother's Pt is suicidal and a polysubstance abuser. Pt denies SI/HI. Pt denies AVH. Pt has been diagnosed with Schizophrenia. Pt denies the diagnosis. Pt denies current medication. Pt states is supposed to be seen at [facility] but does not have the transportation to get to his appointments. Pt reports occasional Vicodin and marijuana use. Pt denies alcohol use. Pt reports previous hospializations for Schizophrenia. Pt denies abuse.  Writer consulted with May, NP. Per May, NP Pt can be D/C. EDP would need to rescind IVC.  Diagnosis:  F20.0 Schizophrenia  Past Medical History:  Past Medical History  Diagnosis Date  . Asthma   . Hallucination, visual   . Schizophrenic disorder (HCC)   . Polysubstance abuse     Past Surgical History  Procedure Laterality Date  . No past surgeries      Family History: History reviewed. No pertinent family history.  Social History:  reports that he has been smoking Cigarettes.  He has a 10 pack-year smoking history. He does not have any smokeless tobacco history on file. He reports that he drinks alcohol. He reports that he uses illicit drugs (Marijuana and Cocaine).  Additional Social History:     CIWA: CIWA-Ar BP: 111/74 mmHg Pulse Rate: 85 COWS:    PATIENT STRENGTHS: (choose at least two) Average or above average intelligence Communication skills  Allergies: No Known Allergies  Home Medications:  (Not in a hospital admission)  OB/GYN Status:  No LMP for male patient.  General Assessment Data Location of Assessment: [facility] TTS Assessment: In system Is this a Tele or Face-to-Face Assessment?: Tele Assessment Is this an Initial Assessment or a Re-assessment for this encounter?: Initial Assessment Marital status: Single Maiden name: NA Is patient pregnant?: No Pregnancy Status: No Living Arrangements: Other relatives Can pt return to current  living arrangement?: Yes Admission Status: Involuntary Is patient capable of signing voluntary admission?: No Referral Source: Self/Family/Friend Insurance type: SP     Crisis Care Plan Living Arrangements: Other relatives Legal Guardian: Other: (NA) Name of Psychiatrist: none Name of Therapist: none  Education Status Is patient currently in school?: No Current Grade: NA Highest grade of school patient has completed: 10 Name of school: NA Contact person: NA  Risk to self with the past 6 months Suicidal Ideation: No Has patient been a risk to self within the past 6 months prior to admission? : No Suicidal Intent: No Has patient had any suicidal intent within the past 6 months prior to admission? : No Is patient at risk for suicide?: No Suicidal Plan?: No Has patient had any suicidal plan within the past 6 months prior to admission? : No Access to Means: No What has been your use of drugs/alcohol within the last 12 months?: marijuana Previous Attempts/Gestures: No How many times?: 0 Other Self Harm Risks: NA Triggers for Past Attempts: None known Intentional Self Injurious Behavior: None Family Suicide History: No Recent stressful life event(s): Other (Comment) (denies) Persecutory voices/beliefs?: No Depression: No Depression Symptoms:  (Pt denies) Substance abuse history and/or treatment for substance abuse?: Yes Suicide prevention information given to non-admitted patients: Not applicable  Risk to Others within the past 6 months Homicidal Ideation: No Does patient have any lifetime risk of violence toward others beyond the six months prior to admission? : No Thoughts of Harm to Others: No Current Homicidal Intent: No Current Homicidal Plan: No Access  to Homicidal Means: No Identified Victim: NA History of harm to others?: No Assessment of Violence: None Noted Violent Behavior Description: NA Does patient have access to weapons?: No Criminal Charges Pending?:  No Does patient have a court date: No Is patient on probation?: No  Psychosis Hallucinations: None noted Delusions: None noted  Mental Status Report Appearance/Hygiene: In scrubs, Unremarkable Eye Contact: Good Motor Activity: Freedom of movement Speech: Logical/coherent Level of Consciousness: Alert Mood: Euthymic Affect: Appropriate to circumstance Anxiety Level: None Thought Processes: Coherent, Relevant Judgement: Unimpaired Orientation: Person, Place, Time, Situation Obsessive Compulsive Thoughts/Behaviors: None  Cognitive Functioning Concentration: Normal Memory: Recent Intact, Remote Intact IQ: Average Insight: Fair Impulse Control: Fair Appetite: Fair Weight Loss: 0 Weight Gain: 0 Sleep: No Change Total Hours of Sleep: 8 Vegetative Symptoms: None  ADLScreening [facility] Assessment Services) Patient's cognitive ability adequate to safely complete daily activities?: Yes Patient able to express need for assistance with ADLs?: Yes Independently performs ADLs?: Yes (appropriate for developmental age)  Prior Inpatient Therapy Prior Inpatient Therapy: Yes Prior Therapy Dates: 2016, 2013 Prior Therapy Facilty/Provider(s): [facility] Reason for Treatment: Depression, Polysubstance use  Prior Outpatient Therapy Prior Outpatient Therapy: No Prior Therapy Dates: na Prior Therapy Facilty/Provider(s): na Reason for Treatment: na Does patient have an ACCT team?: No Does patient have Intensive In-House Services?  : No Does patient have [facility] services? : No Does patient have P4CC services?: No  ADL Screening (condition at time of admission) Patient's cognitive ability adequate to safely complete daily activities?: Yes Patient able to express need for assistance with ADLs?: Yes Independently performs ADLs?: Yes (appropriate for developmental age)             Advance Directives (For Healthcare) Does patient have an advance directive?: No Would patient like  information on creating an advanced directive?: No - patient declined information    Additional Information 1:1 In Past 12 Months?: No CIRT Risk: No Elopement Risk: No Does patient have medical clearance?: Yes     Disposition:  Disposition Initial Assessment Completed for this Encounter: Yes  Tonye Tancredi D 10/13/2015 8:15 AM

## 2015-10-13 NOTE — ED Notes (Signed)
Telepsych assessment completed at this time. 

## 2015-10-13 NOTE — ED Notes (Signed)
[facility] called and reported that pt did not meet inpatient criteria and could process rescention paperwork. EDP aware and reported would complete rescention paperwork.Primary RN aware.

## 2015-10-13 NOTE — ED Notes (Signed)
Patient ambulatory to restroom, [facility] remains with patient.

## 2015-10-13 NOTE — ED Notes (Signed)
[facility] arrived to take patient to jail after discharge. Pt given belongings to change into per Officer request. Copies made of IVC original as well as rescention to give to officer. Officer walked patient out of department prior to discharge instructions being reviewed or vitals being obtained. Officer also reported did not need copies of IVC paperwork or rescention. Copy of paperwork placed in medical records. Originals at ED Secretary desk in envelope at this time.  Patient ambulated out of ED with steady gait. nad noted.

## 2015-10-13 NOTE — ED Provider Notes (Signed)
Patient brought here for evaluation of alleged suicidal statements and a long history of drug abuse. The patient denies to me that he is suicidal or homicidal and does not understand why he is here. His mother filled out an IVC on him and he disputes what she is saying. Patient was evaluated by TTS who does not feel as though he meets criteria for involuntary commitment and believe he is appropriate for discharge. I have personally evaluated the patient as well and he is denying to me that he is suicidal or homicidal and is not feeling as though he is in need of inpatient psychiatric treatment.  Louis Lyonsouglas Shakeeta Godette, MD 10/13/15 1034

## 2015-12-02 ENCOUNTER — Encounter: Payer: Self-pay | Admitting: Internal Medicine

## 2015-12-15 ENCOUNTER — Encounter (HOSPITAL_COMMUNITY): Payer: Self-pay | Admitting: Emergency Medicine

## 2015-12-15 ENCOUNTER — Emergency Department (HOSPITAL_COMMUNITY)
Admission: EM | Admit: 2015-12-15 | Discharge: 2015-12-15 | Disposition: A | Discharge status: Home / Self Care | Payer: Self-pay | Attending: Emergency Medicine | Admitting: Emergency Medicine

## 2015-12-15 DIAGNOSIS — F17210 Nicotine dependence, cigarettes, uncomplicated: Secondary | ICD-10-CM | POA: Insufficient documentation

## 2015-12-15 DIAGNOSIS — J45909 Unspecified asthma, uncomplicated: Secondary | ICD-10-CM | POA: Insufficient documentation

## 2015-12-15 DIAGNOSIS — F419 Anxiety disorder, unspecified: Secondary | ICD-10-CM | POA: Insufficient documentation

## 2015-12-15 LAB — RAPID URINE DRUG SCREEN, HOSP PERFORMED
Amphetamines: POSITIVE — AB
BARBITURATES: NOT DETECTED
BENZODIAZEPINES: NOT DETECTED
COCAINE: NOT DETECTED
Opiates: NOT DETECTED
Tetrahydrocannabinol: POSITIVE — AB

## 2015-12-15 NOTE — ED Provider Notes (Signed)
CSN: [id number]     Arrival date & time 12/15/15  1936 History   First MD Initiated Contact with Patient 12/15/15 1957     Chief Complaint  Patient presents with  . V70.1     (Consider location/radiation/quality/duration/timing/severity/associated sxs/prior Treatment) Patient is a 30 y.o. male presenting with mental health disorder.  Mental Health Problem Presenting symptoms: agitation   Presenting symptoms: no self mutilation and no suicidal thoughts   Patient accompanied by:  Child Degree of incapacity (severity):  Mild Onset quality:  Gradual Timing:  Constant Chronicity:  New Context: not alcohol use and not drug abuse   Treatment compliance:  Untreated Relieved by:  None tried Worsened by:  Nothing tried Ineffective treatments:  None tried Associated symptoms: anxiety   Associated symptoms: no abdominal pain, no chest pain and no hypersomnia     Past Medical History  Diagnosis Date  . Asthma   . Hallucination, visual   . Schizophrenic disorder (HCC)   . Polysubstance abuse    Past Surgical History  Procedure Laterality Date  . No past surgeries     No family history on file. Social History  Substance Use Topics  . Smoking status: Current Every Day Smoker -- 1.00 packs/day for 10 years    Types: Cigarettes  . Smokeless tobacco: None  . Alcohol Use: Yes     Comment: 6 pk beer weekly    Review of Systems  Eyes: Negative for pain and itching.  Respiratory: Negative for cough and shortness of breath.   Cardiovascular: Negative for chest pain.  Gastrointestinal: Negative for abdominal pain.  Genitourinary: Negative for flank pain and enuresis.  Psychiatric/Behavioral: Positive for agitation. Negative for suicidal ideas, sleep disturbance and self-injury. The patient is nervous/anxious. The patient is not hyperactive.   All other systems reviewed and are negative.     Allergies  Review of patient's allergies indicates no known allergies.  Home Medications    Prior to Admission medications   Medication Sig Start Date End Date Taking? Authorizing Provider  albuterol (PROVENTIL HFA;VENTOLIN HFA) 108 (90 BASE) MCG/ACT inhaler Inhale 2 puffs into the lungs every 4 (four) hours as needed for wheezing or shortness of breath. 03/16/15 12/06/17  Sanjuana Kava, NP  amphetamine-dextroamphetamine (ADDERALL XR) 30 MG 24 hr capsule Take 30 mg by mouth daily.    Historical Provider, MD  Buprenorphine HCl-Naloxone HCl (SUBOXONE) 8-2 MG FILM Place 1 Film under the tongue daily.    Historical Provider, MD  FLUoxetine (PROZAC) 10 MG capsule Take 1 capsule (10 mg total) by mouth daily. For depression Patient not taking: Reported on 10/05/2015 03/16/15   Sanjuana Kava, NP   BP 118/71 mmHg  Pulse 104  Temp(Src) 98.7 F (37.1 C) (Oral)  Resp 15  Ht  (1.6 m)  Wt 140 lb (63.504 kg)  BMI 24.81 kg/m2  SpO2 100% Physical Exam  Constitutional: He is oriented to person, place, and time. He appears well-developed and well-nourished.  HENT:  Head: Normocephalic and atraumatic.  Eyes: Pupils are equal, round, and reactive to light.  Neck: Normal range of motion.  Cardiovascular: Normal rate.   Pulmonary/Chest: Effort normal. No respiratory distress.  Abdominal: Soft. He exhibits no distension. There is no tenderness.  Musculoskeletal: Normal range of motion.  Neurological: He is alert and oriented to person, place, and time. No cranial nerve deficit. Coordination normal.  Skin: Skin is warm and dry. No rash noted. No erythema.  Psychiatric: His mood appears anxious. His affect is  not inappropriate. He does not express impulsivity. He expresses no homicidal and no suicidal ideation. He expresses no suicidal plans and no homicidal plans.  Nursing note and vitals reviewed.   ED Course  Procedures (including critical care time) Labs Review Labs Reviewed  URINE RAPID DRUG SCREEN, HOSP PERFORMED - Abnormal; Notable for the following:    Amphetamines POSITIVE (*)     Tetrahydrocannabinol POSITIVE (*)    All other components within normal limits    Imaging Review No results found. I have personally reviewed and evaluated these images and lab results as part of my medical decision-making.   EKG Interpretation None      MDM   Final diagnoses:  Anxiety   IVC'ed by family. No SI/HI/AVH at this time. Has been seen multiple times for the same thing. Doubt he is danger to self or others, will consult TTS for second opinion.  TTS also does not think the patient is a danger to himself or others and patient is stable for discharge. IVC reversed and discharged to the custody of the police.    Marily MemosJason Tyke Outman, MD 12/17/15 [phone]
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
## 2015-12-15 NOTE — ED Notes (Signed)
Pt states mother keeps taking ivc paperwork out on him because from time to time he does illegal drugs. Pt denies any si/hi ideations.

## 2015-12-22 ENCOUNTER — Encounter: Payer: Self-pay | Admitting: Gastroenterology

## 2015-12-22 ENCOUNTER — Other Ambulatory Visit: Payer: Self-pay

## 2015-12-22 ENCOUNTER — Ambulatory Visit (INDEPENDENT_AMBULATORY_CARE_PROVIDER_SITE_OTHER): Payer: Self-pay | Admitting: Gastroenterology

## 2015-12-22 VITALS — BP 144/76 | HR 80 | Temp 97.9°F | Ht 64.0 in | Wt 140.4 lb

## 2015-12-22 DIAGNOSIS — K219 Gastro-esophageal reflux disease without esophagitis: Secondary | ICD-10-CM

## 2015-12-22 DIAGNOSIS — K625 Hemorrhage of anus and rectum: Secondary | ICD-10-CM

## 2015-12-22 DIAGNOSIS — F203 Undifferentiated schizophrenia: Secondary | ICD-10-CM

## 2015-12-22 DIAGNOSIS — R109 Unspecified abdominal pain: Secondary | ICD-10-CM | POA: Insufficient documentation

## 2015-12-22 DIAGNOSIS — R1032 Left lower quadrant pain: Secondary | ICD-10-CM

## 2015-12-22 DIAGNOSIS — R1310 Dysphagia, unspecified: Secondary | ICD-10-CM | POA: Insufficient documentation

## 2015-12-22 MED ORDER — SUCRALFATE 1 GM/10ML PO SUSP: 1.0000 g | Freq: Four times a day (QID) | ORAL | Status: DC

## 2015-12-22 MED ORDER — ONDANSETRON HCL 4 MG PO TABS: 4.0000 mg | ORAL_TABLET | Freq: Three times a day (TID) | ORAL | Status: DC

## 2015-12-22 MED ORDER — LINACLOTIDE 145 MCG PO CAPS: 145.0000 ug | ORAL_CAPSULE | Freq: Every day | ORAL | Status: DC

## 2015-12-22 NOTE — Progress Notes (Signed)
Primary Care Physician:  No PCP Per Patient Primary Gastroenterologist:  Dr. Jena Gauss   Chief Complaint  Patient presents with  . Abdominal Pain  . Rectal Bleeding    HPI:   Louis Scott is a 30 y.o. male presenting today at the request of the ED. Notes chronic pain LLQ abdomen. Intermittent, will sometimes be three days straight.  Will go days without having a BM.  Occasional rectal bleeding. Intermittent N/V. A lot is nerve related. Nausea underlying. Poor appetite. Significant reflux symptoms. Sometimes solid food dysphagia. Tylenol prn. Likes BC powders. Symptoms present for 7 years. States he was told he had schizophrenia then later told he did not. He is on NO psychiatric medications. Mother present with him. Last imaging in Jan 2016 with suggestion of minimal inflammation along the sigmoid colon, otherwise unremarkable.   Past Medical History  Diagnosis Date  . Asthma   . Hallucination, visual   . Schizophrenic disorder (HCC)     unsure if this is true diagnosis, then states he was told anxiety   . Polysubstance abuse     narcotics, clean for 5 months since March 2017     Past Surgical History  Procedure Laterality Date  . No past surgeries      Current Outpatient Prescriptions  Medication Sig Dispense Refill  . albuterol (PROVENTIL HFA;VENTOLIN HFA) 108 (90 BASE) MCG/ACT inhaler Inhale 2 puffs into the lungs every 4 (four) hours as needed for wheezing or shortness of breath. 18 g 0  . [DISCONTINUED] dicyclomine (BENTYL) 20 MG tablet Take 1 tablet (20 mg total) by mouth 3 (three) times daily before meals. As needed for abdominal pain (Patient not taking: Reported on 12/26/2014) 20 tablet 0   No current facility-administered medications for this visit.    Allergies as of 12/22/2015  . (No Known Allergies)    Family History  Problem Relation Age of Onset  . Colon cancer Neg Hx   . Crohn's disease Neg Hx   . Ulcerative colitis Neg Hx     Social History    Social History  . Marital Status: Single    Spouse Name: N/A  . Number of Children: N/A  . Years of Education: N/A   Occupational History  . unemployed    Social History Main Topics  . Smoking status: Current Every Day Smoker -- 1.00 packs/day for 10 years    Types: Cigarettes  . Smokeless tobacco: Not on file  . Alcohol Use: 0.0 oz/week    0 Standard drinks or equivalent per week     Comment: seldome   . Drug Use: Yes    Special: Marijuana, Cocaine     Comment: occasional marijuana   . Sexual Activity: Yes    Birth Control/ Protection: None   Other Topics Concern  . Not on file   Social History Narrative    Review of Systems: Gen: see HPI  CV: Denies chest pain, heart palpitations, peripheral edema, syncope.  Resp: Denies shortness of breath at rest or with exertion. Denies wheezing or cough.  GI: see HPI  GU : Denies urinary burning, urinary frequency, urinary hesitancy MS: Denies joint pain, muscle weakness, cramps, or limitation of movement.  Derm: Denies rash, itching, dry skin Psych: +anxiety/depression  Heme: Denies bruising, bleeding, and enlarged lymph nodes.  Physical Exam: BP 144/76 mmHg  Pulse 80  Temp(Src) 97.9 F (36.6 C)  Ht  (1.626 m)  Wt 140 lb 6.4 oz (63.685 kg)  BMI 24.09 kg/m2 General:   Alert and oriented. Pleasant and cooperative. Fidgets a lot. Appears nervous at times but appropriately responds. Laughs easily.  Head:  Normocephalic and atraumatic. Eyes:  Without icterus, sclera clear and conjunctiva pink.  Ears:  Normal auditory acuity. Nose:  No deformity, discharge,  or lesions. Mouth:  No deformity or lesions, oral mucosa pink.  Lungs:  Clear to auscultation bilaterally. No wheezes, rales, or rhonchi. No distress.  Heart:  S1, S2 present without murmurs appreciated.  Abdomen:  +BS, soft, very mild discomfort LLQ and non-distended. No HSM noted. No guarding or rebound. No masses appreciated.  Rectal:  Deferred  Msk:   Symmetrical without gross deformities. Normal posture. Extremities:  Without  edema. Neurologic:  Alert and  oriented x4;  grossly normal neurologically. Psych:  Alert and cooperative. Normal mood and affect.  Lab Results  Component Value Date   WBC 8.6 10/13/2015   HGB 15.2 10/13/2015   HCT 42.0 10/13/2015   MCV 89.4 10/13/2015   PLT 295 10/13/2015   Lab Results  Component Value Date   CREATININE 0.94 10/13/2015   BUN 15 10/13/2015   NA 138 10/13/2015   K 4.3 10/13/2015   CL 103 10/13/2015   CO2 28 10/13/2015   Lab Results  Component Value Date   ALT 16* 10/13/2015   AST 24 10/13/2015   ALKPHOS 49 10/13/2015   BILITOT 0.6 10/13/2015

## 2015-12-22 NOTE — Patient Instructions (Addendum)
We have scheduled you for a colonoscopy, upper endoscopy, and dilation with Dr. Jena Gaussourk in the near future.  1. Start Dexilant for reflux. Take this every morning. You will need to fill out patient assistance forms to get this covered in the future so you do not have to pay out of pocket.   2. Start Linzess for constipation. Take 1 capsule each morning on an empty stomach, at least 30 minutes before breakfast. You may have some loose stool for the first few days but it should get better. I have provided a voucher card that will give you the first 30 days free. You will need to fill out forms to get patient assistance in the future IF this works well for you.   3. Carafate suspension has been provided to take 4 times a day to help soothe your upper GI symptoms. This is not for long-term use. Zofran for nausea has been sent to pharmacy. I have it written to take before meals. You could also just take every 8 hours as needed.  4. I have referred you to a psychiatrist. I think it's very important to establish care as we discussed.  5. I will see you after the procedures to make sure you are doing well and discuss any changes we need to make.

## 2015-12-28 NOTE — Assessment & Plan Note (Addendum)
30 year old male with 7-year-history of LLQ abdominal pain in the setting of constipation. Last abdominal imaging in Jan 2016 with non-specific findings of minimal inflammation along sigmoid colon. Low-volume hematochezia likely benign in setting of constipation but colonoscopy recommended for further evaluation.   Start Linzess 145 mcg daily Proceed with TCS with Dr. Jena Gaussourk in near future: the risks, benefits, and alternatives have been discussed with the patient in detail. The patient states understanding and desires to proceed. PROPOFOL for sedation in light of mental health history, polysubstance abuse (narcotics) I have also elected to refer him to a psychiatrist, as if we are dealing with IBS, optimizing coping strategies will be imperative in holistic management of GI issues.

## 2015-12-28 NOTE — Assessment & Plan Note (Signed)
Colonoscopy as planned.  

## 2015-12-28 NOTE — Assessment & Plan Note (Signed)
Dilation as appropriate.  

## 2015-12-28 NOTE — Assessment & Plan Note (Addendum)
Intermittent N/V likely secondary to uncontrolled GERD. Intermittent solid food dysphagia. BC powders endorsed. Chronic symptoms. Will start on Dexilant once daily and pursue endoscopic evaluation. Complete avoidance of NSAIDs and aspirin powders recommended.   Proceed with upper endoscopy/dilation in the near future with Dr. Jena Gaussourk. The risks, benefits, and alternatives have been discussed in detail with patient. They have stated understanding and desire to proceed.  PROPOFOL for sedation Dexilant samples provided, short term supply of Carafate, and Zofran.  Follow-up in May 2017

## 2015-12-29 NOTE — Progress Notes (Signed)
No pcp per patient 

## 2015-12-31 ENCOUNTER — Other Ambulatory Visit: Payer: Self-pay

## 2015-12-31 ENCOUNTER — Telehealth: Payer: Self-pay

## 2015-12-31 NOTE — Telephone Encounter (Signed)
Pt called to cancel his TCS because he can not find a ride. He will call back when he can find a ride. I told him that he would have to come back into the office if he was passed his 30 days.

## 2016-01-06 ENCOUNTER — Other Ambulatory Visit (HOSPITAL_COMMUNITY): Payer: Self-pay

## 2016-01-10 ENCOUNTER — Ambulatory Visit (HOSPITAL_COMMUNITY): Admission: RE | Admit: 2016-01-10 | Payer: Self-pay | Source: Ambulatory Visit | Admitting: Internal Medicine

## 2016-01-10 ENCOUNTER — Encounter (HOSPITAL_COMMUNITY): Admission: RE | Payer: Self-pay | Source: Ambulatory Visit

## 2016-01-10 SURGERY — COLONOSCOPY WITH PROPOFOL
Anesthesia: Monitor Anesthesia Care

## 2016-02-04 ENCOUNTER — Telehealth (HOSPITAL_COMMUNITY): Payer: Self-pay | Admitting: *Deleted

## 2016-02-04 NOTE — Telephone Encounter (Signed)
[facility] to [facility]. Called number provided and message stated number has been changed, d/c or no longer in service.

## 2016-02-09 ENCOUNTER — Emergency Department (HOSPITAL_COMMUNITY)
Admission: EM | Admit: 2016-02-09 | Discharge: 2016-02-10 | Disposition: A | Discharge status: Home / Self Care | Payer: Self-pay | Attending: Emergency Medicine | Admitting: Emergency Medicine

## 2016-02-09 ENCOUNTER — Encounter (HOSPITAL_COMMUNITY): Payer: Self-pay

## 2016-02-09 DIAGNOSIS — J45909 Unspecified asthma, uncomplicated: Secondary | ICD-10-CM | POA: Insufficient documentation

## 2016-02-09 DIAGNOSIS — Z79899 Other long term (current) drug therapy: Secondary | ICD-10-CM | POA: Insufficient documentation

## 2016-02-09 DIAGNOSIS — K219 Gastro-esophageal reflux disease without esophagitis: Secondary | ICD-10-CM

## 2016-02-09 DIAGNOSIS — F209 Schizophrenia, unspecified: Secondary | ICD-10-CM | POA: Insufficient documentation

## 2016-02-09 DIAGNOSIS — F17210 Nicotine dependence, cigarettes, uncomplicated: Secondary | ICD-10-CM | POA: Insufficient documentation

## 2016-02-09 DIAGNOSIS — R0789 Other chest pain: Secondary | ICD-10-CM | POA: Insufficient documentation

## 2016-02-09 NOTE — ED Notes (Signed)
Pt reports recurrent burning chest pain, this episode started 3 hours ago with some nausea.

## 2016-02-10 MED ORDER — PANTOPRAZOLE SODIUM 40 MG PO TBEC
40.0000 mg | DELAYED_RELEASE_TABLET | Freq: Once | ORAL | Status: AC
  Administered 2016-02-10: 40 mg via ORAL
  Filled 2016-02-10: qty 1

## 2016-02-10 MED ORDER — GI COCKTAIL ~~LOC~~
30.0000 mL | Freq: Once | ORAL | Status: AC
  Administered 2016-02-10: 30 mL via ORAL
  Filled 2016-02-10: qty 30

## 2016-02-10 MED ORDER — PROMETHAZINE HCL 25 MG PO TABS: 25.0000 mg | ORAL_TABLET | Freq: Four times a day (QID) | ORAL | Status: DC | PRN

## 2016-02-10 MED ORDER — ONDANSETRON 4 MG PO TBDP
4.0000 mg | ORAL_TABLET | Freq: Once | ORAL | Status: AC
  Administered 2016-02-10: 4 mg via ORAL
  Filled 2016-02-10: qty 1

## 2016-02-10 MED ORDER — SUCRALFATE 1 GM/10ML PO SUSP: 1.0000 g | Freq: Three times a day (TID) | ORAL | Status: DC

## 2016-02-10 MED ORDER — OMEPRAZOLE 40 MG PO CPDR: 40.0000 mg | DELAYED_RELEASE_CAPSULE | Freq: Every day | ORAL | Status: DC

## 2016-02-10 NOTE — ED Provider Notes (Signed)
TIME SEEN: 1:00 AM  CHIEF COMPLAINT: Burning chest pain  HPI: Pt is a 30 y.o. male with history of schizophrenia, asthma, polysubstance abuse who is currently on Suboxone who presents to the emergency department with 2 years of intermittent episodes of burning chest pain. Pain is in the center of his chest and epigastrium. He states it is worse after eating and that he will have a burning, bitter, sour taste in his mouth. Reports he does have nausea but no vomiting. Has had bloody stools in the past but none currently. No melena. Denies use of NSAIDs, alcohol. Has never had an endoscopy. Has been told that this is acid reflux and has been instructed to follow-up with a gastroenterologist for endoscopy but he states he was "scared". No family history of esophageal cancer. No shortness of breath. No diaphoresis or dizziness.  No history of PE, DVT, exogenous estrogen use, fracture, surgery, trauma, hospitalization, prolonged travel. No lower extremity swelling or pain. No calf tenderness.   ROS: See HPI Constitutional: no fever  Eyes: no drainage  ENT: no runny nose   Cardiovascular:   chest pain  Resp: no SOB  GI: no vomiting GU: no dysuria Integumentary: no rash  Allergy: no hives  Musculoskeletal: no leg swelling  Neurological: no slurred speech ROS otherwise negative  PAST MEDICAL HISTORY/PAST SURGICAL HISTORY:  Past Medical History  Diagnosis Date  . Asthma   . Hallucination, visual   . Schizophrenic disorder (HCC)     unsure if this is true diagnosis, then states he was told anxiety   . Polysubstance abuse     narcotics, clean for 5 months since March 2017     MEDICATIONS:  Prior to Admission medications   Medication Sig Start Date End Date Taking? Authorizing Provider  albuterol (PROVENTIL HFA;VENTOLIN HFA) 108 (90 BASE) MCG/ACT inhaler Inhale 2 puffs into the lungs every 4 (four) hours as needed for wheezing or shortness of breath. 03/16/15 12/06/17 Yes Sanjuana KavaAgnes I Nwoko, NP   Linaclotide (LINZESS) 145 MCG CAPS capsule Take 1 capsule (145 mcg total) by mouth daily. Take 30 minutes before breakfast 12/22/15   Nira RetortAnna W Sams, NP  ondansetron (ZOFRAN) 4 MG tablet Take 1 tablet (4 mg total) by mouth 3 (three) times daily before meals. 12/22/15   Nira RetortAnna W Sams, NP  sucralfate (CARAFATE) 1 GM/10ML suspension Take 10 mLs (1 g total) by mouth 4 (four) times daily. 12/22/15   Nira RetortAnna W Sams, NP    ALLERGIES:  No Known Allergies  SOCIAL HISTORY:  Social History  Substance Use Topics  . Smoking status: Current Every Day Smoker -- 1.00 packs/day for 10 years    Types: Cigarettes  . Smokeless tobacco: Not on file  . Alcohol Use: 0.0 oz/week    0 Standard drinks or equivalent per week     Comment: seldome     FAMILY HISTORY: Family History  Problem Relation Age of Onset  . Colon cancer Neg Hx   . Crohn's disease Neg Hx   . Ulcerative colitis Neg Hx     EXAM: BP 147/94 mmHg  Pulse 99  Temp(Src) 98.7 F (37.1 C) (Oral)  Resp 17  Ht 5\' 4"  (1.626 m)  Wt 140 lb (63.504 kg)  BMI 24.02 kg/m2  SpO2 99% CONSTITUTIONAL: Alert and oriented and responds appropriately to questions. Well-appearing; well-nourished, appears very anxious HEAD: Normocephalic EYES: Conjunctivae clear, PERRL ENT: normal nose; no rhinorrhea; moist mucous membranes NECK: Supple, no meningismus, no LAD  CARD: RRR; S1 and  S2 appreciated; no murmurs, no clicks, no rubs, no gallops RESP: Normal chest excursion without splinting or tachypnea; breath sounds clear and equal bilaterally; no wheezes, no rhonchi, no rales, no hypoxia or respiratory distress, speaking full sentences ABD/GI: Normal bowel sounds; non-distended; soft, non-tender, no rebound, no guarding, no peritoneal signs BACK:  The back appears normal and is non-tender to palpation, there is no CVA tenderness EXT: Normal ROM in all joints; non-tender to palpation; no edema; normal capillary refill; no cyanosis, no calf tenderness or swelling     SKIN: Normal color for age and race; warm; no rash NEURO: Moves all extremities equally, sensation to light touch intact diffusely, cranial nerves II through XII intact PSYCH: The patient's mood and manner are appropriate. Grooming and personal hygiene are appropriate.  MEDICAL DECISION MAKING: Patient here with what sounds like GERD. Episodes have been occurring for 2 years. EKG shows sinus tachycardia but I suspect this is from him being very anxious. Otherwise no ischemic abnormality. He has no risk factors for pulmonary embolus. Doubt ACS. Doubt dissection. We'll give GI cocktail, Protonix, Zofran and reassess.  ED PROGRESS: Patient reports feeling much better. Pain is now gone. Able to drink anything without difficulty. I have advised him that he should follow-up with gastroenterology as an outpatient. Will discharge with omeprazole, Carafate, Phenergan. Have advised him to avoid alcohol, NSAIDs and eat a bland diet. Discussed return precautions. He verbalizes understanding and is comfortable with this plan.  At this time, I do not feel there is any life-threatening condition present. I have reviewed and discussed all results (EKG, imaging, lab, urine as appropriate), exam findings with patient. I have reviewed nursing notes and appropriate previous records.  I feel the patient is safe to be discharged home without further emergent workup. Discussed usual and customary return precautions. Patient and family (if present) verbalize understanding and are comfortable with this plan.  Patient will follow-up with their primary care provider. If they do not have a primary care provider, information for follow-up has been provided to them. All questions have been answered.   EKG Interpretation  Date/Time:  Wednesday Feb 09 2016 23:55:48 EDT Ventricular Rate:  110 PR Interval:  172 QRS Duration: 99 QT Interval:  321 QTC Calculation: 434 R Axis:   73 Text Interpretation:  Sinus tachycardia Biatrial  enlargement RSR' in V1 or V2, right VCD or RVH No significant change since last tracing Confirmed by Calyse Murcia,  DO, Kayson Bullis [phone]) on 02/10/2016 12:17:27 AM        Layla Maw Ester Hilley, DO 02/10/16 0401

## 2016-02-10 NOTE — Discharge Instructions (Signed)
Gastroesophageal Reflux Disease, Adult Normally, food travels down the esophagus and stays in the stomach to be digested. However, when a person has gastroesophageal reflux disease (GERD), food and stomach acid move back up into the esophagus. When this happens, the esophagus becomes sore and inflamed. Over time, GERD can create small holes (ulcers) in the lining of the esophagus.  CAUSES This condition is caused by a problem with the muscle between the esophagus and the stomach (lower esophageal sphincter, or LES). Normally, the LES muscle closes after food passes through the esophagus to the stomach. When the LES is weakened or abnormal, it does not close properly, and that allows food and stomach acid to go back up into the esophagus. The LES can be weakened by certain dietary substances, medicines, and medical conditions, including:  Tobacco use.  Pregnancy.  Having a hiatal hernia.  Heavy alcohol use.  Certain foods and beverages, such as coffee, chocolate, onions, and peppermint. RISK FACTORS This condition is more likely to develop in:  People who have an increased body weight.  People who have connective tissue disorders.  People who use NSAID medicines. SYMPTOMS Symptoms of this condition include:  Heartburn.  Difficult or painful swallowing.  The feeling of having a lump in the throat.  Abitter taste in the mouth.  Bad breath.  Having a large amount of saliva.  Having an upset or bloated stomach.  Belching.  Chest pain.  Shortness of breath or wheezing.  Ongoing (chronic) cough or a night-time cough.  Wearing away of tooth enamel.  Weight loss. Different conditions can cause chest pain. Make sure to see your health care provider if you experience chest pain. DIAGNOSIS Your health care provider will take a medical history and perform a physical exam. To determine if you have mild or severe GERD, your health care provider may also monitor how you respond  to treatment. You may also have other tests, including:  An endoscopy toexamine your stomach and esophagus with a small camera.  A test thatmeasures the acidity level in your esophagus.  A test thatmeasures how much pressure is on your esophagus.  A barium swallow or modified barium swallow to show the shape, size, and functioning of your esophagus. TREATMENT The goal of treatment is to help relieve your symptoms and to prevent complications. Treatment for this condition may vary depending on how severe your symptoms are. Your health care provider may recommend:  Changes to your diet.  Medicine.  Surgery. HOME CARE INSTRUCTIONS Diet  Follow a diet as recommended by your health care provider. This may involve avoiding foods and drinks such as:  Coffee and tea (with or without caffeine).  Drinks that containalcohol.  Energy drinks and sports drinks.  Carbonated drinks or sodas.  Chocolate and cocoa.  Peppermint and mint flavorings.  Garlic and onions.  Horseradish.  Spicy and acidic foods, including peppers, chili powder, curry powder, vinegar, hot sauces, and barbecue sauce.  Citrus fruit juices and citrus fruits, such as oranges, lemons, and limes.  Tomato-based foods, such as red sauce, chili, salsa, and pizza with red sauce.  Fried and fatty foods, such as donuts, french fries, potato chips, and high-fat dressings.  High-fat meats, such as hot dogs and fatty cuts of red and white meats, such as rib eye steak, sausage, ham, and bacon.  High-fat dairy items, such as whole milk, butter, and cream cheese.  Eat small, frequent meals instead of large meals.  Avoid drinking large amounts of liquid with your   meals.  Avoid eating meals during the 2-3 hours before bedtime.  Avoid lying down right after you eat.  Do not exercise right after you eat. General Instructions  Pay attention to any changes in your symptoms.  Take over-the-counter and prescription  medicines only as told by your health care provider. Do not take aspirin, ibuprofen, or other NSAIDs unless your health care provider told you to do so.  Do not use any tobacco products, including cigarettes, chewing tobacco, and e-cigarettes. If you need help quitting, ask your health care provider.  Wear loose-fitting clothing. Do not wear anything tight around your waist that causes pressure on your abdomen.  Raise (elevate) the head of your bed 6 inches (15cm).  Try to reduce your stress, such as with yoga or meditation. If you need help reducing stress, ask your health care provider.  If you are overweight, reduce your weight to an amount that is healthy for you. Ask your health care provider for guidance about a safe weight loss goal.  Keep all follow-up visits as told by your health care provider. This is important. SEEK MEDICAL CARE IF:  You have new symptoms.  You have unexplained weight loss.  You have difficulty swallowing, or it hurts to swallow.  You have wheezing or a persistent cough.  Your symptoms do not improve with treatment.  You have a hoarse voice. SEEK IMMEDIATE MEDICAL CARE IF:  You have pain in your arms, neck, jaw, teeth, or back.  You feel sweaty, dizzy, or light-headed.  You have chest pain or shortness of breath.  You vomit and your vomit looks like blood or coffee grounds.  You faint.  Your stool is bloody or black.  You cannot swallow, drink, or eat.   This information is not intended to replace advice given to you by your health care provider. Make sure you discuss any questions you have with your health care provider.   Document Released: 07/05/2005 Document Revised: 06/16/2015 Document Reviewed: 01/20/2015 Elsevier Interactive Patient Education 2016 Else[employer] Food Choices for Gastroesophageal Reflux Disease, Adult When you have gastroesophageal reflux disease (GERD), the foods you eat and your eating habits are very important.  Choosing the right foods can help ease the discomfort of GERD. WHAT GENERAL GUIDELINES DO I NEED TO FOLLOW?  Choose fruits, vegetables, whole grains, low-fat dairy products, and low-fat meat, fish, and poultry.  Limit fats such as oils, salad dressings, butter, nuts, and avocado.  Keep a food diary to identify foods that cause symptoms.  Avoid foods that cause reflux. These may be different for different people.  Eat frequent small meals instead of three large meals each day.  Eat your meals slowly, in a relaxed setting.  Limit fried foods.  Cook foods using methods other than frying.  Avoid drinking alcohol.  Avoid drinking large amounts of liquids with your meals.  Avoid bending over or lying down until 2-3 hours after eating. WHAT FOODS ARE NOT RECOMMENDED? The following are some foods and drinks that may worsen your symptoms: Vegetables Tomatoes. Tomato juice. Tomato and spaghetti sauce. Chili peppers. Onion and garlic. Horseradish. Fruits Oranges, grapefruit, and lemon (fruit and juice). Meats High-fat meats, fish, and poultry. This includes hot dogs, ribs, ham, sausage, salami, and bacon. Dairy Whole milk and chocolate milk. Sour cream. Cream. Butter. Ice cream. Cream cheese.  Beverages Coffee and tea, with or without caffeine. Carbonated beverages or energy drinks. Condiments Hot sauce. Barbecue sauce.  Sweets/Desserts Chocolate and cocoa. Donuts. Peppermint and spearmint.   Fats and Oils High-fat foods, including French fries and potato chips. Other Vinegar. Strong spices, such as black pepper, white pepper, red pepper, cayenne, curry powder, cloves, ginger, and chili powder. The items listed above may not be a complete list of foods and beverages to avoid. Contact your dietitian for more information.   This information is not intended to replace advice given to you by your health care provider. Make sure you discuss any questions you have with your health care  provider.   Document Released: 09/25/2005 Document Revised: 10/16/2014 Document Reviewed: 07/30/2013 Elsevier Interactive Patient Education 2016 Elsevier Inc.  

## 2016-02-11 ENCOUNTER — Other Ambulatory Visit: Payer: Self-pay

## 2016-02-11 ENCOUNTER — Encounter: Payer: Self-pay | Admitting: Nurse Practitioner

## 2016-02-11 ENCOUNTER — Ambulatory Visit (INDEPENDENT_AMBULATORY_CARE_PROVIDER_SITE_OTHER): Payer: Self-pay | Admitting: Nurse Practitioner

## 2016-02-11 VITALS — BP 121/77 | HR 109 | Temp 98.0°F | Ht 65.0 in | Wt 146.6 lb

## 2016-02-11 DIAGNOSIS — K219 Gastro-esophageal reflux disease without esophagitis: Secondary | ICD-10-CM

## 2016-02-11 DIAGNOSIS — R110 Nausea: Secondary | ICD-10-CM

## 2016-02-11 DIAGNOSIS — K625 Hemorrhage of anus and rectum: Secondary | ICD-10-CM

## 2016-02-11 DIAGNOSIS — K920 Hematemesis: Secondary | ICD-10-CM

## 2016-02-11 DIAGNOSIS — R1310 Dysphagia, unspecified: Secondary | ICD-10-CM

## 2016-02-11 DIAGNOSIS — R1084 Generalized abdominal pain: Secondary | ICD-10-CM

## 2016-02-11 NOTE — Progress Notes (Signed)
Referring Provider: No ref. provider found Primary Care Physician:  No PCP Per Patient Primary GI:  Dr. Jena Gauss  Chief Complaint  Patient presents with  . Rectal Bleeding  . Hematemesis    HPI:   Louis Scott is a 30 y.o. male who presents for follow-up from the emergency department for GERD. Last seen in our office 12/22/2015 for abdominal pain, rectal bleeding, GERD, dysphagia. At that time described 7 year history of left lower quadrant pain with constipation, abdominal imaging January 2016 with nonspecific findings of minimal inflammation along sigmoid colon. Low volume hematochezia in setting of constipation likely benign. Was started on Linzess 145 g, and scheduled for colonoscopy on propofol/MAC. For GERD, describes intermittent nausea vomiting, was taking BC powders, solid food dysphagia. Was started on Dexilant once daily and scheduled for EGD with possible dilation. Recommended avoid NSAIDs and aspirin powders. His procedure scheduled for 01/10/2016 but he called to cancel it as he did not have a ride.  Was seen in the emergency department on 02/09/2016 for chest/epigastric pain. No labs or imaging done. Deemed likely due to GERD, started on omeprazole. In the ED improved with GI cocktail, Protonix, Zofran. EKG with tachycardia otherwise normal.  Today he is accompanied by an adult male who states "I'm like his second mother and probably the only one who is worried about him." She states "he is a very very sick person and do you think he needs to be hospitalized?" Today he states he didn't get Dexilant filled last time due to financial reasons. Did get one fill of Carafate done. Pain in the ER was "severe." Still with continued GERD symptoms including esophageal burning, bitter taste. Still having rectal bleeding when he has a bowel movement, intermittent amounts. Vomiting blood as well, about once a week. Hematemesis is moderate in amount. UDS done 12/15/15 posititve for  amphetamines and THC. No recent CBC. Has a bowel movement every 2 to 4 days.Didn't get Linzess filled. Passes out about 3 times a month at random, no pre-syncopal symptoms. Energy is low. Difficulty eating. Continued dysphagia.    Person accompanying patient states "he doesn't have szhitzophrenia honey.  States "he's lost a lot of weight." Objectively he has put on 6 pounds from last visit 12/22/15.  Past Medical History  Diagnosis Date  . Asthma   . Hallucination, visual   . Schizophrenic disorder (HCC)     unsure if this is true diagnosis, then states he was told anxiety   . Polysubstance abuse     narcotics, clean for 5 months since March 2017     Past Surgical History  Procedure Laterality Date  . No past surgeries      Current Outpatient Prescriptions  Medication Sig Dispense Refill  . albuterol (PROVENTIL HFA;VENTOLIN HFA) 108 (90 BASE) MCG/ACT inhaler Inhale 2 puffs into the lungs every 4 (four) hours as needed for wheezing or shortness of breath. 18 g 0  . [DISCONTINUED] dicyclomine (BENTYL) 20 MG tablet Take 1 tablet (20 mg total) by mouth 3 (three) times daily before meals. As needed for abdominal pain (Patient not taking: Reported on 12/26/2014) 20 tablet 0  . [DISCONTINUED] Linaclotide (LINZESS) 145 MCG CAPS capsule Take 1 capsule (145 mcg total) by mouth daily. Take 30 minutes before breakfast 30 capsule 5   No current facility-administered medications for this visit.    Allergies as of 02/11/2016  . (No Known Allergies)    Family History  Problem Relation Age of Onset  .  Colon cancer Neg Hx   . Crohn's disease Neg Hx   . Ulcerative colitis Neg Hx     Social History   Social History  . Marital Status: Single    Spouse Name: N/A  . Number of Children: N/A  . Years of Education: N/A   Occupational History  . unemployed    Social History Main Topics  . Smoking status: Current Every Day Smoker -- 1.00 packs/day for 10 years    Types: Cigarettes  .  Smokeless tobacco: Never Used  . Alcohol Use: 0.0 oz/week    0 Standard drinks or equivalent per week     Comment: rarely, one beer a month on average  . Drug Use: No     Comment: Last marijuana 3 months ago. Last cocaine over a year ago.  Marland Kitchen. Sexual Activity: Yes    Birth Control/ Protection: None   Other Topics Concern  . None   Social History Narrative    Review of Systems: General: Negative for anorexia, weight loss, fever, chills. ENT: Negative for hoarseness. CV: Negative for chest pain, angina, palpitations, dyspnea on exertion, peripheral edema.  Respiratory: Negative for dyspnea at rest, cough, sputum, wheezing.  GI: See history of present illness. MS: Negative for joint pain, low back pain.  Derm: Negative for rash or itching.  Psych: Admits anxiety. Denies other mental health problems  Endo: Describes weight loss as per HPI.  Heme: Negative for bruising or bleeding.   Physical Exam: BP 121/77 mmHg  Pulse 109  Temp(Src) 98 F (36.7 C) (Oral)  Ht 5\' 5"  (1.651 m)  Wt 146 lb 9.6 oz (66.497 kg)  BMI 24.40 kg/m2 General:   Alert and oriented. Pleasant and cooperative. Well-nourished and well-developed.  Head:  Normocephalic and atraumatic. Eyes:  Without icterus, sclera clear and conjunctiva pink.  Ears:  Normal auditory acuity. Cardiovascular:  S1, S2 present without murmurs appreciated. Normal bilateral DP pulses noted. Extremities without clubbing or edema. Respiratory:  Clear to auscultation bilaterally. No wheezes, rales, or rhonchi. No distress.  Gastrointestinal:  +BS, soft, and non-distended. TTP noted LLQ and epigastric are. No HSM noted. No guarding or rebound. No masses appreciated.  Rectal:  Deferred  Musculoskalatal:  Symmetrical without gross deformities. Skin:  Intact without significant lesions or rashes. Neurologic:  Alert and oriented x4;  grossly normal neurologically. Psych:  Alert and cooperative. Normal mood and affect. Heme/Lymph/Immune: No  excessive bruising noted.    02/11/2016 11:12 AM   Disclaimer: This note was dictated with voice recognition software. Similar sounding words can inadvertently be transcribed and may not be corrected upon review.

## 2016-02-11 NOTE — Patient Instructions (Addendum)
1. Have your labs drawn when you're able to. 2. We will schedule your procedures for you. 3. Avoid all NSAIDs (ibuprofen, Motrin, Advil, naproxen, Naprosyn, etc.) and aspirin powders (BC powders, Goody powders). 4. Take Dexilant once a day. We will provide you with samples to last a couple weeks. 5. Return for follow-up in 6-8 weeks. 6. He should become established with a primary care provider who can follow your other chronic problems and make referrals to specialist as necessary. 7. If you have severe abdominal pain, nausea and vomiting of prevention from keeping down food or fluids, persistent bleeding, or other alerting symptoms proceed to the emergency room for further evaluation. 8. We will provide you with papers to apply for [facility] financial assistance.

## 2016-02-14 ENCOUNTER — Other Ambulatory Visit: Payer: Self-pay

## 2016-02-14 NOTE — Assessment & Plan Note (Signed)
Patient notes rectal bleeding as per history of present illness. History of alcohol and drug abuse. We'll check CBC, CMP, lipase. Colonoscopy for further evaluation in addition to an endoscopy as noted above. ER precautions given, recommend no NSAIDs or aspirin powders. Return for follow-up in 6-8 weeks.  Proceed with TCS with propofol/MAC with Dr. Jena Gaussourk in near future: the risks, benefits, and alternatives have been discussed with the patient in detail. The patient states understanding and desires to proceed.

## 2016-02-14 NOTE — Assessment & Plan Note (Addendum)
Patient admits hematemesis, states it is worse when drinking. Likely due to persistent esophagitis and excessive alcohol consumption. We'll check CBC, CMP, lipase. Endoscopy with as noted above, ER precautions given, no NSAID/aspirin powders. Return for follow-up in 6-8 weeks.

## 2016-02-14 NOTE — Assessment & Plan Note (Signed)
Notes occasionally severe epigastric and lower abdominal pain. Has been seen multiple times in the emergency department for GERD symptoms. Last ER visit improved with GI cocktail. Noncompliant with medications including Linzess, Dexilant, omeprazole, Carafate. Recommend he get his PPI filled, ER precautions, avoid NSAIDs and aspirin powders. Endoscopy and colonoscopy as noted above. Labs as noted above. Return for follow-up in 6-8 weeks.

## 2016-02-14 NOTE — Assessment & Plan Note (Addendum)
Patient with repeated ER visits for GERD symptoms. Improved with GI cocktail in the emergency room. History of noncompliance with prescribed medications for GERD, states because of financial reasons. Both he and the person accompanying him asked about narcotic pain medications for his pain. At this point we will set him up with an endoscopy with possible dilation, recommend avoid NSAIDs and aspirin powders, provide paperwork for [facility] financial assistance, recommend establishing with a primary care, and provide ER precautions. Return for follow-up in 6-8 weeks.  He was a no-show for his last colonoscopy and endoscopy, states "I got cold feet." Discussed importance of these exams to him. He verbalized understanding.  Proceed with EGD with propofol/MAC with Dr. Jena Gaussourk in near future: the risks, benefits, and alternatives have been discussed with the patient in detail. The patient states understanding and desires to proceed.  History of narcotic abuse and drug use. We'll provide for propofol/MAC to promote adequate sedation.

## 2016-02-14 NOTE — Progress Notes (Signed)
NO PCP PER PATIENT °

## 2016-02-14 NOTE — Assessment & Plan Note (Addendum)
Patient with dysphagia symptoms in addition to his GERD symptoms. We will proceed with endoscopy with possible dilation as noted above. At this point most likely cause is persistent, uncontrolled GERD/esophagitis but cannot rule out stricture, web, or ring.

## 2016-02-16 NOTE — Patient Instructions (Signed)
Louis CoyJustin L Scott  02/16/2016     @PREFPERIOPPHARMACY @   Your procedure is scheduled on 02/21/2016.  [facility] at [facility]:45 [facility].  Call this number if you have problems the morning of surgery:  [phone]   Remember:  Do not eat food or drink liquids after midnight.  Take these medicines the morning of surgery with A SIP OF WATER Albuterol inhaler (also bring with you to hospital)   Do not wear jewelry, make-up or nail polish.  Do not wear lotions, powders, or perfumes.  You may wear deodorant.  Do not shave 48 hours prior to surgery.  Men may shave face and neck.  Do not bring valuables to the hospital.  [facility] any belongings or valuables.  Contacts, dentures or bridgework may not be worn into surgery.  Leave your suitcase in the car.  After surgery it may be brought to your room.  For patients admitted to the hospital, discharge time will be determined by your treatment team.  Patients discharged the day of surgery will not be allowed to drive home.    Please read over the following fact sheets that you were given. Anesthesia Post-op Instructions     PATIENT INSTRUCTIONS POST-ANESTHESIA  IMMEDIATELY FOLLOWING SURGERY:  Do not drive or operate machinery for the first twenty four hours after surgery.  Do not make any important decisions for twenty four hours after surgery or while taking narcotic pain medications or sedatives.  If you develop intractable nausea and vomiting or a severe headache please notify your doctor immediately.  FOLLOW-UP:  Please make an appointment with your surgeon as instructed. You do not need to follow up with anesthesia unless specifically instructed to do so.  WOUND CARE INSTRUCTIONS (if applicable):  Keep a dry clean dressing on the anesthesia/puncture wound site if there is drainage.  Once the wound has quit draining you may leave it open to air.  Generally you should leave the bandage intact for twenty four  hours unless there is drainage.  If the epidural site drains for more than 36-48 hours please call the anesthesia department.  QUESTIONS?:  Please feel free to call your physician or the hospital operator if you have any questions, and they will be happy to assist you.      Esophageal Dilatation Esophageal dilatation is a procedure to open a blocked or narrowed part of the esophagus. The esophagus is the long tube in your throat that carries food and liquid from your mouth to your stomach. The procedure is also called esophageal dilation.  You may need this procedure if you have a buildup of scar tissue in your esophagus that makes it difficult, painful, or even impossible to swallow. This can be caused by gastroesophageal reflux disease (GERD). In rare cases, people need this procedure because they have cancer of the esophagus or a problem with the way food moves through the esophagus. Sometimes you may need to have another dilatation to enlarge the opening of the esophagus gradually. [facility] KNOW ABOUT:   Any allergies you have.  All medicines you are taking, including vitamins, herbs, eye drops, creams, and over-the-counter medicines.  Previous problems you or members of your family have had with the use of anesthetics.  Any blood disorders you have.  Previous surgeries you have had.  Medical conditions you have.  Any antibiotic medicines you are required to take before dental procedures. RISKS AND COMPLICATIONS Generally, this is  a safe procedure. However, problems can occur and include:  Bleeding from a tear in the lining of the esophagus.  A hole (perforation) in the esophagus. BEFORE THE PROCEDURE  Do not eat or drink anything after midnight on the night before the procedure or as directed by your health care provider.  Ask your health care provider about changing or stopping your regular medicines. This is especially important if you are taking diabetes  medicines or blood thinners.  Plan to have someone take you home after the procedure. PROCEDURE   You will be given a medicine that makes you relaxed and sleepy (sedative).  A medicine may be sprayed or gargled to numb the back of the throat.  Your health care provider can use various instruments to do an esophageal dilatation. During the procedure, the instrument used will be placed in your mouth and passed down into your esophagus. Options include:  Simple dilators. This instrument is carefully placed in the esophagus to stretch it.  Guided wire bougies. In this method, a flexible tube (endoscope) is used to insert a wire into the esophagus. The dilator is passed over this wire to enlarge the esophagus. Then the wire is removed.  Balloon dilators. An endoscope with a small balloon at the end is passed down into the esophagus. Inflating the balloon gently stretches the esophagus and opens it up. AFTER THE PROCEDURE  Your blood pressure, heart rate, breathing rate, and blood oxygen level will be monitored often until the medicines you were given have worn off.  Your throat may feel slightly sore and will probably still feel numb. This will improve slowly over time.  You will not be allowed to eat or drink until the throat numbness has resolved.  If this is a same-day procedure, you may be allowed to go home once you have been able to drink, urinate, and sit on the edge of the bed without nausea or dizziness.  If this is a same-day procedure, you should have a friend or family member with you for the next 24 hours after the procedure.   This information is not intended to replace advice given to you by your health care provider. Make sure you discuss any questions you have with your health care provider.   Document Released: 11/16/2005 Document Revised: 10/16/2014 Document Reviewed: 02/04/2014 Elsevier Interactive Patient Education 2016 [employer]. Esophagogastroduodenoscopy Esophagogastroduodenoscopy (EGD) is a procedure that is used to examine the lining of the esophagus, stomach, and first part of the small intestine (duodenum). A long, flexible, lighted tube with a camera attached (endoscope) is inserted down the throat to view these organs. This procedure is done to detect problems or abnormalities, such as inflammation, bleeding, ulcers, or growths, in order to treat them. The procedure lasts 5-20 minutes. It is usually an outpatient procedure, but it may need to be performed in a hospital in emergency cases. LET [facility] CARE PROVIDER KNOW ABOUT:  Any allergies you have.  All medicines you are taking, including vitamins, herbs, eye drops, creams, and over-the-counter medicines.  Previous problems you or members of your family have had with the use of anesthetics.  Any blood disorders you have.  Previous surgeries you have had.  Medical conditions you have. RISKS AND COMPLICATIONS Generally, this is a safe procedure. However, problems can occur and include:  Infection.  Bleeding.  Tearing (perforation) of the esophagus, stomach, or duodenum.  Difficulty breathing or not being able to breathe.  Excessive sweating.  Spasms of the  larynx.  Slowed heartbeat.  Low blood pressure. BEFORE THE PROCEDURE  Do not eat or drink anything after midnight on the night before the procedure or as directed by your health care provider.  Do not take your regular medicines before the procedure if your health care provider asks you not to. Ask your health care provider about changing or stopping those medicines.  If you wear dentures, be prepared to remove them before the procedure.  Arrange for someone to drive you home after the procedure. PROCEDURE  A numbing medicine (local anesthetic) may be sprayed in your throat for comfort and to stop you from gagging or coughing.  You will have an IV tube inserted in a vein in  your hand or arm. You will receive medicines and fluids through this tube.  You will be given a medicine to relax you (sedative).  A pain reliever will be given through the IV tube.  A mouth guard may be placed in your mouth to protect your teeth and to keep you from biting on the endoscope.  You will be asked to lie on your left side.  The endoscope will be inserted down your throat and into your esophagus, stomach, and duodenum.  Air will be put through the endoscope to allow your health care provider to clearly view the lining of your esophagus.  The lining of your esophagus, stomach, and duodenum will be examined. During the exam, your health care provider may:  Remove tissue to be examined under a microscope (biopsy) for inflammation, infection, or other medical problems.  Remove growths.  Remove objects (foreign bodies) that are stuck.  Treat any bleeding with medicines or other devices that stop tissues from bleeding (hot cautery, clipping devices).  Widen (dilate) or stretch narrowed areas of your esophagus and stomach.  The endoscope will be withdrawn. AFTER THE PROCEDURE  You will be taken to a recovery area for observation. Your blood pressure, heart rate, breathing rate, and blood oxygen level will be monitored often until the medicines you were given have worn off.  Do not eat or drink anything until the numbing medicine has worn off and your gag reflex has returned. You may choke.  Your health care provider should be able to discuss his or her findings with you. It will take longer to discuss the test results if any biopsies were taken.   This information is not intended to replace advice given to you by your health care provider. Make sure you discuss any questions you have with your health care provider.   Document Released: 01/26/2005 Document Revised: 10/16/2014 Document Reviewed: 08/28/2012 Elsevier Interactive Patient Education 2016 [employer]. Colonoscopy A colonoscopy is an exam to look at the entire large intestine (colon). This exam can help find problems such as tumors, polyps, inflammation, and areas of bleeding. The exam takes about 1 hour.  LET [facility] CARE PROVIDER KNOW ABOUT:   Any allergies you have.  All medicines you are taking, including vitamins, herbs, eye drops, creams, and over-the-counter medicines.  Previous problems you or members of your family have had with the use of anesthetics.  Any blood disorders you have.  Previous surgeries you have had.  Medical conditions you have. RISKS AND COMPLICATIONS  Generally, this is a safe procedure. However, as with any procedure, complications can occur. Possible complications include:  Bleeding.  Tearing or rupture of the colon wall.  Reaction to medicines given during the exam.  Infection (rare). BEFORE THE PROCEDURE  Ask your health care provider about changing or stopping your regular medicines.  You may be prescribed an oral bowel prep. This involves drinking a large amount of medicated liquid, starting the day before your procedure. The liquid will cause you to have multiple loose stools until your stool is almost clear or light green. This cleans out your colon in preparation for the procedure.  Do not eat or drink anything else once you have started the bowel prep, unless your health care provider tells you it is safe to do so.  Arrange for someone to drive you home after the procedure. PROCEDURE   You will be given medicine to help you relax (sedative).  You will lie on your side with your knees bent.  A long, flexible tube with a light and camera on the end (colonoscope) will be inserted through the rectum and into the colon. The camera sends video back to a computer screen as it moves through the colon. The colonoscope also releases carbon dioxide gas to inflate the colon. This helps your health care provider see the area  better.  During the exam, your health care provider may take a small tissue sample (biopsy) to be examined under a microscope if any abnormalities are found.  The exam is finished when the entire colon has been viewed. AFTER THE PROCEDURE   Do not drive for 24 hours after the exam.  You may have a small amount of blood in your stool.  You may pass moderate amounts of gas and have mild abdominal cramping or bloating. This is caused by the gas used to inflate your colon during the exam.  Ask when your test results will be ready and how you will get your results. Make sure you get your test results.   This information is not intended to replace advice given to you by your health care provider. Make sure you discuss any questions you have with your health care provider.   Document Released: 09/22/2000 Document Revised: 07/16/2013 Document Reviewed: 06/02/2013 Elsevier Interactive Patient Education [employer].

## 2016-02-17 ENCOUNTER — Encounter (HOSPITAL_COMMUNITY)
Admission: RE | Admit: 2016-02-17 | Discharge: 2016-02-17 | Disposition: A | Discharge status: Home / Self Care | Payer: Self-pay | Source: Ambulatory Visit | Attending: Internal Medicine | Admitting: Internal Medicine

## 2016-02-17 ENCOUNTER — Encounter (HOSPITAL_COMMUNITY): Payer: Self-pay

## 2016-02-17 DIAGNOSIS — Z01812 Encounter for preprocedural laboratory examination: Secondary | ICD-10-CM | POA: Insufficient documentation

## 2016-02-17 HISTORY — DX: Anxiety disorder, unspecified: F41.9

## 2016-02-17 HISTORY — DX: Bipolar disorder, unspecified: F31.9

## 2016-02-17 HISTORY — DX: Gastro-esophageal reflux disease without esophagitis: K21.9

## 2016-02-17 LAB — BASIC METABOLIC PANEL
Anion gap: 8 (ref 5–15)
BUN: 14 mg/dL (ref 6–20)
CALCIUM: 9.3 mg/dL (ref 8.9–10.3)
CO2: 26 mmol/L (ref 22–32)
Chloride: 102 mmol/L (ref 101–111)
Creatinine, Ser: 0.93 mg/dL (ref 0.61–1.24)
Glucose, Bld: 92 mg/dL (ref 65–99)
POTASSIUM: 4.6 mmol/L (ref 3.5–5.1)
SODIUM: 136 mmol/L (ref 135–145)

## 2016-02-17 LAB — CBC
HCT: 39.8 % (ref 39.0–52.0)
Hemoglobin: 14.3 g/dL (ref 13.0–17.0)
MCH: 31.1 pg (ref 26.0–34.0)
MCHC: 35.9 g/dL (ref 30.0–36.0)
MCV: 86.5 fL (ref 78.0–100.0)
Platelets: 280 10*3/uL (ref 150–400)
RBC: 4.6 MIL/uL (ref 4.22–5.81)
RDW: 12.8 % (ref 11.5–15.5)
WBC: 6 10*3/uL (ref 4.0–10.5)

## 2016-02-21 ENCOUNTER — Ambulatory Visit (HOSPITAL_COMMUNITY): Admission: RE | Admit: 2016-02-21 | Payer: Self-pay | Source: Ambulatory Visit | Admitting: Internal Medicine

## 2016-02-21 ENCOUNTER — Encounter (HOSPITAL_COMMUNITY): Admission: RE | Payer: Self-pay | Source: Ambulatory Visit

## 2016-02-21 SURGERY — COLONOSCOPY WITH PROPOFOL
Anesthesia: Monitor Anesthesia Care

## 2016-02-25 ENCOUNTER — Ambulatory Visit: Payer: Self-pay | Admitting: Gastroenterology

## 2016-03-15 ENCOUNTER — Telehealth: Payer: Self-pay | Admitting: Nurse Practitioner

## 2016-03-15 ENCOUNTER — Ambulatory Visit: Payer: Self-pay | Admitting: Nurse Practitioner

## 2016-03-15 ENCOUNTER — Encounter: Payer: Self-pay | Admitting: Nurse Practitioner

## 2016-03-15 NOTE — Telephone Encounter (Signed)
Noted  

## 2016-03-15 NOTE — Telephone Encounter (Signed)
PATIENT WAS A NO SHOW AND LETTER SENT  °

## 2016-03-16 ENCOUNTER — Encounter (HOSPITAL_COMMUNITY): Payer: Self-pay

## 2016-03-16 ENCOUNTER — Emergency Department (HOSPITAL_COMMUNITY)
Admission: EM | Admit: 2016-03-16 | Discharge: 2016-03-16 | Disposition: A | Discharge status: Home / Self Care | Payer: Self-pay | Attending: Emergency Medicine | Admitting: Emergency Medicine

## 2016-03-16 DIAGNOSIS — H9209 Otalgia, unspecified ear: Secondary | ICD-10-CM | POA: Insufficient documentation

## 2016-03-16 DIAGNOSIS — F319 Bipolar disorder, unspecified: Secondary | ICD-10-CM | POA: Insufficient documentation

## 2016-03-16 DIAGNOSIS — J029 Acute pharyngitis, unspecified: Secondary | ICD-10-CM

## 2016-03-16 DIAGNOSIS — J45909 Unspecified asthma, uncomplicated: Secondary | ICD-10-CM | POA: Insufficient documentation

## 2016-03-16 DIAGNOSIS — R112 Nausea with vomiting, unspecified: Secondary | ICD-10-CM | POA: Insufficient documentation

## 2016-03-16 DIAGNOSIS — K219 Gastro-esophageal reflux disease without esophagitis: Secondary | ICD-10-CM | POA: Insufficient documentation

## 2016-03-16 DIAGNOSIS — F17210 Nicotine dependence, cigarettes, uncomplicated: Secondary | ICD-10-CM | POA: Insufficient documentation

## 2016-03-16 DIAGNOSIS — Z79899 Other long term (current) drug therapy: Secondary | ICD-10-CM | POA: Insufficient documentation

## 2016-03-16 LAB — COMPREHENSIVE METABOLIC PANEL
ALBUMIN: 4.5 g/dL (ref 3.5–5.0)
ALT: 14 U/L — AB (ref 17–63)
AST: 21 U/L (ref 15–41)
Alkaline Phosphatase: 54 U/L (ref 38–126)
Anion gap: 7 (ref 5–15)
BUN: 10 mg/dL (ref 6–20)
CHLORIDE: 98 mmol/L — AB (ref 101–111)
CO2: 30 mmol/L (ref 22–32)
CREATININE: 0.94 mg/dL (ref 0.61–1.24)
Calcium: 9.6 mg/dL (ref 8.9–10.3)
GFR calc Af Amer: >60 mL/min (ref >60)
GLUCOSE: 108 mg/dL — AB (ref 65–99)
POTASSIUM: 3.4 mmol/L — AB (ref 3.5–5.1)
SODIUM: 135 mmol/L (ref 135–145)
Total Bilirubin: 0.5 mg/dL (ref 0.3–1.2)
Total Protein: 7 g/dL (ref 6.5–8.1)

## 2016-03-16 LAB — CBC WITH DIFFERENTIAL/PLATELET
BASOS PCT: 0 %
Basophils Absolute: 0 10*3/uL (ref 0.0–0.1)
Eosinophils Absolute: 0.2 10*3/uL (ref 0.0–0.7)
Eosinophils Relative: 2 %
HEMATOCRIT: 39.7 % (ref 39.0–52.0)
HEMOGLOBIN: 14.5 g/dL (ref 13.0–17.0)
LYMPHS ABS: 1.6 10*3/uL (ref 0.7–4.0)
LYMPHS PCT: 17 %
MCH: 31.4 pg (ref 26.0–34.0)
MCHC: 36.5 g/dL — AB (ref 30.0–36.0)
MCV: 85.9 fL (ref 78.0–100.0)
MONO ABS: 0.8 10*3/uL (ref 0.1–1.0)
MONOS PCT: 8 %
NEUTROS ABS: 6.8 10*3/uL (ref 1.7–7.7)
Neutrophils Relative %: 73 %
Platelets: 267 10*3/uL (ref 150–400)
RBC: 4.62 MIL/uL (ref 4.22–5.81)
RDW: 12.7 % (ref 11.5–15.5)
WBC: 9.3 10*3/uL (ref 4.0–10.5)

## 2016-03-16 LAB — LIPASE, BLOOD: Lipase: 11 U/L (ref 11–51)

## 2016-03-16 MED ORDER — FAMOTIDINE IN NACL 20-0.9 MG/50ML-% IV SOLN
20.0000 mg | Freq: Once | INTRAVENOUS | Status: AC
  Administered 2016-03-16: 20 mg via INTRAVENOUS
  Filled 2016-03-16: qty 50

## 2016-03-16 MED ORDER — SODIUM CHLORIDE 0.9 % IV BOLUS (SEPSIS)
1000.0000 mL | Freq: Once | INTRAVENOUS | Status: AC
  Administered 2016-03-16: 1000 mL via INTRAVENOUS

## 2016-03-16 MED ORDER — RANITIDINE HCL 150 MG PO CAPS: 150.0000 mg | ORAL_CAPSULE | Freq: Every day | ORAL | Status: DC

## 2016-03-16 MED ORDER — ONDANSETRON HCL 4 MG/2ML IJ SOLN
4.0000 mg | Freq: Once | INTRAMUSCULAR | Status: AC
  Administered 2016-03-16: 4 mg via INTRAVENOUS
  Filled 2016-03-16: qty 2

## 2016-03-16 MED ORDER — ONDANSETRON 4 MG PO TBDP: 4.0000 mg | ORAL_TABLET | Freq: Three times a day (TID) | ORAL | Status: AC | PRN

## 2016-03-16 MED ORDER — OMEPRAZOLE 20 MG PO CPDR: 20.0000 mg | DELAYED_RELEASE_CAPSULE | Freq: Every day | ORAL | Status: DC

## 2016-03-16 MED ORDER — GI COCKTAIL ~~LOC~~
30.0000 mL | Freq: Once | ORAL | Status: AC
  Administered 2016-03-16: 30 mL via ORAL
  Filled 2016-03-16: qty 30

## 2016-03-16 NOTE — ED Notes (Signed)
Pt says has bad acid reflux caused by stress.  Pt says last night he threw up blood and felt something pop in r ear while vomiting radiating into throat and chest.

## 2016-03-16 NOTE — ED Notes (Signed)
Pt states the GI cocktail has relieved pain.

## 2016-03-16 NOTE — ED Provider Notes (Signed)
CSN: [id number]     Arrival date & time 03/16/16  1032 History   First MD Initiated Contact with Patient 03/16/16 1041     Chief Complaint  Patient presents with  . Sore Throat    Louis Scott is a 30 y.o. male with a history of gastric reflux, polysubstance abuse, on Suboxone, bipolar disorder, and anxiety who presents to the ED complaining of a sore throat, trouble swallowing and hematemesis starting yesterday. The patient reports that yesterday he was having lots of problems with his acid reflux. He is currently on no medications for acid reflux. He reports this caused him to become very nauseous and he had some vomiting. He has had lots of belching. He reports vomiting twice yesterday with bright red blood in his vomit. He reports making himself vomit today and had no blood in his vomit. He's had no abdominal pain. He reports a history of hematemesis and is due to have an endoscopy by Dr. Jena Gauss. He has not followed up with GI. He reports he's had intermittent hematemesis for many years and supposed to have endoscopy. He reports his pain begins in his right ear and radiates down through his throat and into his chest. He had no throat pain until he began vomiting. He reports his pain is only with swallowing. No chest pain without swallowing. He has taken nothing for treatment of his symptoms today. He denies fevers, abdominal pain, diarrhea, hematochezia, lightheadedness, dizziness, syncope, shortness of breath or trouble breathing.  Patient is a 30 y.o. male presenting with pharyngitis. The history is provided by the patient. No language interpreter was used.  Sore Throat Associated symptoms include chest pain, nausea, a sore throat and vomiting. Pertinent negatives include no abdominal pain, chills, congestion, coughing, fever, headaches, neck pain or rash.    Past Medical History  Diagnosis Date  . Asthma   . Hallucination, visual   . Schizophrenic disorder (HCC)     unsure if this is true  diagnosis, then states he was told anxiety   . Polysubstance abuse     narcotics, clean for 5 months since March 2017   . Bipolar disorder (HCC)   . Anxiety   . GERD (gastroesophageal reflux disease)    Past Surgical History  Procedure Laterality Date  . No past surgeries     Family History  Problem Relation Age of Onset  . Colon cancer Neg Hx   . Crohn's disease Neg Hx   . Ulcerative colitis Neg Hx    Social History  Substance Use Topics  . Smoking status: Current Every Day Smoker -- 1.00 packs/day for 10 years    Types: Cigarettes  . Smokeless tobacco: Never Used  . Alcohol Use: 0.0 oz/week    0 Standard drinks or equivalent per week     Comment: rarely, one beer a month on average    Review of Systems  Constitutional: Negative for fever and chills.  HENT: Positive for ear pain and sore throat. Negative for congestion, ear discharge, mouth sores, trouble swallowing and voice change.   Eyes: Negative for visual disturbance.  Respiratory: Negative for cough and shortness of breath.   Cardiovascular: Positive for chest pain.  Gastrointestinal: Positive for nausea and vomiting. Negative for abdominal pain, diarrhea and blood in stool.  Genitourinary: Negative for dysuria.  Musculoskeletal: Negative for back pain and neck pain.  Skin: Negative for rash.  Neurological: Negative for syncope, light-headedness and headaches.      Allergies  Review of  patient's allergies indicates no known allergies.  Home Medications   Prior to Admission medications   Medication Sig Start Date End Date Taking? Authorizing Provider  albuterol (PROVENTIL HFA;VENTOLIN HFA) 108 (90 BASE) MCG/ACT inhaler Inhale 2 puffs into the lungs every 4 (four) hours as needed for wheezing or shortness of breath. 03/16/15 12/06/17 Yes Sanjuana Kava, NP  buprenorphine-naloxone (SUBOXONE) 8-2 MG SUBL SL tablet Place 1 tablet under the tongue 2 (two) times daily.   Yes Historical Provider, MD  omeprazole  (PRILOSEC) 20 MG capsule Take 1 capsule (20 mg total) by mouth daily. 03/16/16   Everlene Farrier, PA-C  ondansetron (ZOFRAN ODT) 4 MG disintegrating tablet Take 1 tablet (4 mg total) by mouth every 8 (eight) hours as needed for nausea or vomiting. 03/16/16   Everlene Farrier, PA-C  ranitidine (ZANTAC) 150 MG capsule Take 1 capsule (150 mg total) by mouth daily. 03/16/16   Everlene Farrier, PA-C   BP 137/76 mmHg  Pulse 97  Temp(Src) 98.6 F (37 C) (Oral)  Resp 17  Ht  (1.651 m)  Wt 63.504 kg  BMI 23.30 kg/m2  SpO2 99% Physical Exam  Constitutional: He appears well-developed and well-nourished. No distress.  Nontoxic-appearing. Patient appears uncomfortable.  HENT:  Head: Normocephalic and atraumatic.  Right Ear: External ear normal.  Left Ear: External ear normal.  Mouth/Throat: No oropharyngeal exudate.  Mild tonsillar hypertrophy without exudates. Posterior oropharyngeal erythema without edema. Uvula is midline without edema. No peritonsillar abscess. No trismus. No drooling. Tongue protrusion is normal. Speech is clear and coherent. Bilateral tympanic membranes are pearly-gray without erythema or loss of landmarks.   Eyes: Conjunctivae are normal. Pupils are equal, round, and reactive to light. Right eye exhibits no discharge. Left eye exhibits no discharge.  Neck: Normal range of motion. Neck supple. No JVD present. No tracheal deviation present.  Cardiovascular: Normal rate, regular rhythm, normal heart sounds and intact distal pulses.   Heart rate is 100.  Pulmonary/Chest: Effort normal and breath sounds normal. No stridor. No respiratory distress. He has no wheezes. He has no rales.  Lungs are clear to auscultation bilaterally.  Abdominal: Soft. Bowel sounds are normal. He exhibits no distension. There is no tenderness. There is no guarding.  Abdomen is soft and nontender to palpation.  Musculoskeletal: He exhibits no edema.  Lymphadenopathy:    He has no cervical adenopathy.   Neurological: He is alert. Coordination normal.  Skin: Skin is warm and dry. No rash noted. He is not diaphoretic. No erythema. No pallor.  Psychiatric: He has a normal mood and affect. His behavior is normal.  Nursing note and vitals reviewed.   ED Course  Procedures (including critical care time) Labs Review Labs Reviewed  COMPREHENSIVE METABOLIC PANEL - Abnormal; Notable for the following:    Potassium 3.4 (*)    Chloride 98 (*)    Glucose, Bld 108 (*)    ALT 14 (*)    All other components within normal limits  CBC WITH DIFFERENTIAL/PLATELET - Abnormal; Notable for the following:    MCHC 36.5 (*)    All other components within normal limits  LIPASE, BLOOD    Imaging Review No results found. I have personally reviewed and evaluated these lab results as part of my medical decision-making.   EKG Interpretation   Date/Time:  Thursday March 16 2016 10:53:32 EDT Ventricular Rate:  96 PR Interval:  170 QRS Duration: 105 QT Interval:  327 QTC Calculation: 413 R Axis:   57 Text Interpretation:  Sinus rhythm RSR' in V1 or V2, right VCD or RVH No  significant change was found Confirmed by Manus GunningANCOUR  MD, STEPHEN [phone]([id number]) on  03/16/2016 11:11:28 AM     Filed Vitals:   03/16/16 1100 03/16/16 1130 03/16/16 1200 03/16/16 1249  BP: 142/76 131/86 131/78 137/76  Pulse: 94 95 94 97  Temp:      TempSrc:      Resp: 20 8 24 17   Height:      Weight:      SpO2: 98% 94% 98% 99%    MDM   Meds given in ED:  Medications  sodium chloride 0.9 % bolus 1,000 mL (0 mLs Intravenous Stopped 03/16/16 1248)  gi cocktail (Maalox,Lidocaine,Donnatal) (30 mLs Oral Given 03/16/16 1115)  famotidine (PEPCID) IVPB 20 mg premix (0 mg Intravenous Stopped 03/16/16 1212)  ondansetron (ZOFRAN) injection 4 mg (4 mg Intravenous Given 03/16/16 1115)    Discharge Medication List as of 03/16/2016 12:38 PM    START taking these medications   Details  omeprazole (PRILOSEC) 20 MG capsule Take 1 capsule (20 mg total) by  mouth daily., Starting 03/16/2016, Until Discontinued, Print    ondansetron (ZOFRAN ODT) 4 MG disintegrating tablet Take 1 tablet (4 mg total) by mouth every 8 (eight) hours as needed for nausea or vomiting., Starting 03/16/2016, Until Discontinued, Print    ranitidine (ZANTAC) 150 MG capsule Take 1 capsule (150 mg total) by mouth daily., Starting 03/16/2016, Until Discontinued, Print        Final diagnoses:  Gastroesophageal reflux disease, esophagitis presence not specified  Sore throat   This is a 30 y.o. male with a history of gastric reflux, polysubstance abuse, on Suboxone, bipolar disorder, and anxiety who presents to the ED complaining of a sore throat, trouble swallowing and hematemesis starting yesterday. The patient reports that yesterday he was having lots of problems with his acid reflux. He is currently on no medications for acid reflux. He reports this caused him to become very nauseous and he had some vomiting. He has had lots of belching. He reports vomiting twice yesterday with bright red blood in his vomit. He reports making himself vomit today and had no blood in his vomit. He's had no abdominal pain. He reports a history of hematemesis and is due to have an endoscopy by Dr. Jena Gaussourk. He has not followed up with GI. He reports he's had intermittent hematemesis for many years and supposed to have endoscopy. He reports his pain begins in his right ear and radiates down through his throat and into his chest. He had no throat pain until he began vomiting. He reports his pain is only with swallowing. No chest pain without swallowing. He has taken nothing for treatment of his symptoms today.  On exam the patient is afebrile nontoxic appearing. His abdomen is soft and nontender to palpation. He has some posterior oropharyngeal erythema without edema. No evidence of blood in his oropharynx. EKG shows no significant change from his last tracing. CMP, lipase and CBC are unremarkable. No leukocytosis.  No anemia. At reevaluation after Pepcid, GI cocktail and fluid bolus of patient reports feeling much better. He is tolerating by mouth. He has had no vomiting in the emergency department. He had no blood in his vomit today. Patient is seen GI before. He needs to follow-up with GI. Will start the patient on medicines for his acid reflux. We'll start him on omeprazole, and Zantac. I provided him coupons for his prescriptions because the patient reported they  were expensive. I discussed strict and specific return precautions. I advised the patient to follow-up with their primary care provider this week. I advised the patient to return to the emergency department with new or worsening symptoms or new concerns. The patient verbalized understanding and agreement with plan.    This patient was discussed with Dr. Manus Gunning who agrees with assessment and plan.    Everlene Farrier, PA-C 03/16/16 1257  Glynn Octave, MD 03/16/16 [phone]

## 2016-03-16 NOTE — Discharge Instructions (Signed)
Hematemesis Hematemesis is when you vomit blood. It is a sign of bleeding in the upper part of your digestive tract. This is also called your gastrointestinal (GI) tract. Your upper GI tract includes your mouth, throat, esophagus, stomach, and the first part of your small intestine (duodenum).  Hematemesis is usually caused by bleeding from your esophagus or stomach. You may suddenly vomit bright red blood. You might also vomit old blood. It may look like coffee grounds. You may also have other symptoms, such as:  Stomach pain.  Heartburn.  Black and tarry stool.  HOME CARE INSTRUCTIONS  Watch your hematemesis for any changes. The following actions may help to lessen any discomfort you are feeling:  Take medicines only as directed by your health care provider. Do not take aspirin, ibuprofen, or any other anti-inflammatory medicine without approval from your health care provider.  Rest as needed.  Drink small sips of clear liquids often, as long as you can keep them down. Try to drink enough fluids to keep your urine clear or pale yellow.  Do not drink alcohol.  Do not use any tobacco products, including cigarettes, chewing tobacco, or electronic cigarettes. If you need help quitting, ask your health care provider.  Keep all follow-up visits as directed by your health care provider. This is important. SEEK MEDICAL CARE IF:   The vomiting of blood worsens, or begins again after it has stopped.  You have persistent stomach pain.  You have nausea, indigestion, or heartburn.  You feel weak or dizzy. SEEK IMMEDIATE MEDICAL CARE IF:   You faint or feel extremely weak.  You have a rapid heartbeat.  You are urinating less than normal or not at all.  You have persistent vomiting.  You vomit large amounts of bloody or dark material.  You vomit bright red blood.  You pass large, dark, or bloody stools.  You have chest pain or trouble breathing.   This information is not  intended to replace advice given to you by your health care provider. Make sure you discuss any questions you have with your health care provider.   Document Released: 11/02/2004 Document Revised: 10/16/2014 Document Reviewed: 05/20/2014 Elsevier Interactive Patient Education 2016 [employer]. Food Choices for Gastroesophageal Reflux Disease, Adult When you have gastroesophageal reflux disease (GERD), the foods you eat and your eating habits are very important. Choosing the right foods can help ease the discomfort of GERD. WHAT GENERAL GUIDELINES DO I NEED TO FOLLOW?  Choose fruits, vegetables, whole grains, low-fat dairy products, and low-fat meat, fish, and poultry.  Limit fats such as oils, salad dressings, butter, nuts, and avocado.  Keep a food diary to identify foods that cause symptoms.  Avoid foods that cause reflux. These may be different for different people.  Eat frequent small meals instead of three large meals each day.  Eat your meals slowly, in a relaxed setting.  Limit fried foods.  Cook foods using methods other than frying.  Avoid drinking alcohol.  Avoid drinking large amounts of liquids with your meals.  Avoid bending over or lying down until 2-3 hours after eating. WHAT FOODS ARE NOT RECOMMENDED? The following are some foods and drinks that may worsen your symptoms: Vegetables Tomatoes. Tomato juice. Tomato and spaghetti sauce. Chili peppers. Onion and garlic. Horseradish. Fruits Oranges, grapefruit, and lemon (fruit and juice). Meats High-fat meats, fish, and poultry. This includes hot dogs, ribs, ham, sausage, salami, and bacon. Dairy Whole milk and chocolate milk. Sour cream. Cream. Butter. Ice cream.  Cream cheese.  Beverages Coffee and tea, with or without caffeine. Carbonated beverages or energy drinks. Condiments Hot sauce. Barbecue sauce.  Sweets/Desserts Chocolate and cocoa. Donuts. Peppermint and spearmint. Fats and Oils High-fat foods,  including [place] fries and potato chips. Other Vinegar. Strong spices, such as black pepper, white pepper, red pepper, cayenne, curry powder, cloves, ginger, and chili powder. The items listed above may not be a complete list of foods and beverages to avoid. Contact your dietitian for more information.   This information is not intended to replace advice given to you by your health care provider. Make sure you discuss any questions you have with your health care provider.   Document Released: 09/25/2005 Document Revised: 10/16/2014 Document Reviewed: 07/30/2013 Elsevier Interactive Patient Education 2016 Elsevier Inc. Gastroesophageal Reflux Disease, Adult Normally, food travels down the esophagus and stays in the stomach to be digested. However, when a person has gastroesophageal reflux disease (GERD), food and stomach acid move back up into the esophagus. When this happens, the esophagus becomes sore and inflamed. Over time, GERD can create small holes (ulcers) in the lining of the esophagus.  CAUSES This condition is caused by a problem with the muscle between the esophagus and the stomach (lower esophageal sphincter, or LES). Normally, the LES muscle closes after food passes through the esophagus to the stomach. When the LES is weakened or abnormal, it does not close properly, and that allows food and stomach acid to go back up into the esophagus. The LES can be weakened by certain dietary substances, medicines, and medical conditions, including:  Tobacco use.  Pregnancy.  Having a hiatal hernia.  Heavy alcohol use.  Certain foods and beverages, such as coffee, chocolate, onions, and peppermint. RISK FACTORS This condition is more likely to develop in:  People who have an increased body weight.  People who have connective tissue disorders.  People who use NSAID medicines. SYMPTOMS Symptoms of this condition include:  Heartburn.  Difficult or painful swallowing.  The feeling  of having a lump in the throat.  Abitter taste in the mouth.  Bad breath.  Having a large amount of saliva.  Having an upset or bloated stomach.  Belching.  Chest pain.  Shortness of breath or wheezing.  Ongoing (chronic) cough or a night-time cough.  Wearing away of tooth enamel.  Weight loss. Different conditions can cause chest pain. Make sure to see your health care provider if you experience chest pain. DIAGNOSIS Your health care provider will take a medical history and perform a physical exam. To determine if you have mild or severe GERD, your health care provider may also monitor how you respond to treatment. You may also have other tests, including:  An endoscopy toexamine your stomach and esophagus with a small camera.  A test thatmeasures the acidity level in your esophagus.  A test thatmeasures how much pressure is on your esophagus.  A barium swallow or modified barium swallow to show the shape, size, and functioning of your esophagus. TREATMENT The goal of treatment is to help relieve your symptoms and to prevent complications. Treatment for this condition may vary depending on how severe your symptoms are. Your health care provider may recommend:  Changes to your diet.  Medicine.  Surgery. HOME CARE INSTRUCTIONS Diet  Follow a diet as recommended by your health care provider. This may involve avoiding foods and drinks such as:  Coffee and tea (with or without caffeine).  Drinks that containalcohol.  Energy drinks and sports drinks.  Carbonated drinks or sodas.  Chocolate and cocoa.  Peppermint and mint flavorings.  Garlic and onions.  Horseradish.  Spicy and acidic foods, including peppers, chili powder, curry powder, vinegar, hot sauces, and barbecue sauce.  Citrus fruit juices and citrus fruits, such as oranges, lemons, and limes.  Tomato-based foods, such as red sauce, chili, salsa, and pizza with red sauce.  Fried and fatty  foods, such as donuts, french fries, potato chips, and high-fat dressings.  High-fat meats, such as hot dogs and fatty cuts of red and white meats, such as rib eye steak, sausage, ham, and bacon.  High-fat dairy items, such as whole milk, butter, and cream cheese.  Eat small, frequent meals instead of large meals.  Avoid drinking large amounts of liquid with your meals.  Avoid eating meals during the 2-3 hours before bedtime.  Avoid lying down right after you eat.  Do not exercise right after you eat. General Instructions  Pay attention to any changes in your symptoms.  Take over-the-counter and prescription medicines only as told by your health care provider. Do not take aspirin, ibuprofen, or other NSAIDs unless your health care provider told you to do so.  Do not use any tobacco products, including cigarettes, chewing tobacco, and e-cigarettes. If you need help quitting, ask your health care provider.  Wear loose-fitting clothing. Do not wear anything tight around your waist that causes pressure on your abdomen.  Raise (elevate) the head of your bed 6 inches (15cm).  Try to reduce your stress, such as with yoga or meditation. If you need help reducing stress, ask your health care provider.  If you are overweight, reduce your weight to an amount that is healthy for you. Ask your health care provider for guidance about a safe weight loss goal.  Keep all follow-up visits as told by your health care provider. This is important. SEEK MEDICAL CARE IF:  You have new symptoms.  You have unexplained weight loss.  You have difficulty swallowing, or it hurts to swallow.  You have wheezing or a persistent cough.  Your symptoms do not improve with treatment.  You have a hoarse voice. SEEK IMMEDIATE MEDICAL CARE IF:  You have pain in your arms, neck, jaw, teeth, or back.  You feel sweaty, dizzy, or light-headed.  You have chest pain or shortness of breath.  You vomit and  your vomit looks like blood or coffee grounds.  You faint.  Your stool is bloody or black.  You cannot swallow, drink, or eat.   This information is not intended to replace advice given to you by your health care provider. Make sure you discuss any questions you have with your health care provider.   Document Released: 07/05/2005 Document Revised: 06/16/2015 Document Reviewed: 01/20/2015 Elsevier Interactive Patient Education [employer] Elsevier Inc.

## 2016-03-16 NOTE — ED Notes (Signed)
Pt states although pain is better, it does still hurt in epigastric area when swallowing.

## 2016-04-12 ENCOUNTER — Ambulatory Visit: Payer: Self-pay | Admitting: Gastroenterology

## 2016-04-12 ENCOUNTER — Telehealth: Payer: Self-pay | Admitting: Gastroenterology

## 2016-04-12 ENCOUNTER — Encounter: Payer: Self-pay | Admitting: Gastroenterology

## 2016-04-12 NOTE — Telephone Encounter (Signed)
PT WAS A NO SHOW AND LETTER SENT  °

## 2017-05-31 ENCOUNTER — Encounter (HOSPITAL_COMMUNITY): Payer: Self-pay | Admitting: Emergency Medicine

## 2017-05-31 ENCOUNTER — Emergency Department (HOSPITAL_COMMUNITY)
Admission: EM | Admit: 2017-05-31 | Discharge: 2017-06-01 | Disposition: A | Discharge status: Home / Self Care | Payer: Self-pay | Attending: Emergency Medicine | Admitting: Emergency Medicine

## 2017-05-31 DIAGNOSIS — F17210 Nicotine dependence, cigarettes, uncomplicated: Secondary | ICD-10-CM | POA: Insufficient documentation

## 2017-05-31 DIAGNOSIS — Z79899 Other long term (current) drug therapy: Secondary | ICD-10-CM | POA: Insufficient documentation

## 2017-05-31 DIAGNOSIS — J45909 Unspecified asthma, uncomplicated: Secondary | ICD-10-CM | POA: Insufficient documentation

## 2017-05-31 DIAGNOSIS — Z87898 Personal history of other specified conditions: Secondary | ICD-10-CM

## 2017-05-31 DIAGNOSIS — Z8719 Personal history of other diseases of the digestive system: Secondary | ICD-10-CM | POA: Insufficient documentation

## 2017-05-31 DIAGNOSIS — K219 Gastro-esophageal reflux disease without esophagitis: Secondary | ICD-10-CM | POA: Insufficient documentation

## 2017-05-31 MED ORDER — GI COCKTAIL ~~LOC~~
30.0000 mL | Freq: Once | ORAL | Status: AC
  Administered 2017-06-01: 30 mL via ORAL
  Filled 2017-05-31: qty 30

## 2017-05-31 MED ORDER — OMEPRAZOLE 20 MG PO CPDR: 20.0000 mg | DELAYED_RELEASE_CAPSULE | Freq: Every day | ORAL | 0 refills | Status: DC

## 2017-05-31 NOTE — ED Triage Notes (Addendum)
Pt c/o burning in esophagus all day. He states acid has been coming up in throat all day.

## 2017-05-31 NOTE — Discharge Instructions (Signed)
As discussed, please follow-up with gastroenterology for further evaluation for peptic ulcer via EGD. Take omeprazole daily 30 minutes before breakfast. Avoid smoking, alcohol, spicy foods, and try to follow a bland diet.  Return if you experience any worsening or new concerning symptoms.

## 2017-05-31 NOTE — ED Provider Notes (Signed)
[facility]EM[facility]ENCY DEPT Provider Note   CSN: [id number] Arrival date & time: 05/31/17  1957     History   Chief Complaint Chief Complaint  Patient presents with  . Gastroesophageal Reflux    HPI Louis Scott is a 31 y.o. male presenting with severe acid reflux. He explains that he has had these symptoms in the past and was supposed to follow-up with gastroenterology but didn't. He has been taking Tums and Pepcid over-the-counter for immediate relief but today he didn't get as much relief. He explained that he finally started to feel better just now after some time being here in the emergency department. He explains that he clearly recognizes this discomfort as reflux that he has been having for years. He denies abdominal pain, vomiting, diarrhea, fever, chills or other symptoms. HPI  Past Medical History:  Diagnosis Date  . Anxiety   . Asthma   . Bipolar disorder (HCC)   . GERD (gastroesophageal reflux disease)   . Hallucination, visual   . Polysubstance abuse    narcotics, clean for 5 months since March 2017   . Schizophrenic disorder (HCC)    unsure if this is true diagnosis, then states he was told anxiety     Patient Active Problem List   Diagnosis Date Noted  . Hematemesis 02/11/2016  . GERD (gastroesophageal reflux disease) 12/22/2015  . Dysphagia 12/22/2015  . Rectal bleeding 12/22/2015  . Abdominal pain 12/22/2015  . MDD (major depressive disorder) 03/16/2015  . Polysubstance dependence including opioid type drug, episodic abuse (HCC) 03/14/2015  . Substance induced mood disorder (HCC) 03/14/2015  . Drug overdose, intentional (HCC) 03/14/2015  . 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 Cannabis abuse 06/18/2012    Past Surgical History:  Procedure Laterality Date  . NO PAST SURGERIES         Home Medications    Prior to Admission medications   Medication Sig Start Date End Date Taking? Authorizing Provider  albuterol (PROVENTIL HFA;VENTOLIN  HFA) 108 (90 BASE) MCG/ACT inhaler Inhale 2 puffs into the lungs every 4 (four) hours as needed for wheezing or shortness of breath. 03/16/15 12/06/17  Armandina Stammer I, NP  buprenorphine-naloxone (SUBOXONE) 8-2 MG SUBL SL tablet Place 1 tablet under the tongue 2 (two) times daily.    [provider]  omeprazole (PRILOSEC) 20 MG capsule Take 1 capsule (20 mg total) by mouth daily. 05/31/17   Mathews Robinsons B, PA-C  ondansetron (ZOFRAN ODT) 4 MG disintegrating tablet Take 1 tablet (4 mg total) by mouth every 8 (eight) hours as needed for nausea or vomiting. 03/16/16   Everlene Farrier, PA-C  ranitidine (ZANTAC) 150 MG capsule Take 1 capsule (150 mg total) by mouth daily. 03/16/16   Everlene Farrier, PA-C    Family History Family History  Problem Relation Age of Onset  . Colon cancer Neg Hx   . Crohn's disease Neg Hx   . Ulcerative colitis Neg Hx     Social History Social History  Substance Use Topics  . Smoking status: Current Every Day Smoker    Packs/day: 1.00    Years: 10.00    Types: Cigarettes  . Smokeless tobacco: Never Used  . Alcohol use 0.0 oz/week     Comment: rarely, one beer a month on average     Allergies   Patient has no known allergies.   Review of Systems Review of Systems  Constitutional: Negative for chills and fever.  Respiratory: Negative for chest tightness and shortness of breath.  Cardiovascular: Negative for chest pain and palpitations.  Gastrointestinal: Negative for abdominal distention, diarrhea and vomiting.       Heartburn  Skin: Negative for color change and pallor.     Physical Exam Updated Vital Signs BP (!) 146/89 (BP Location: Right Arm)   Pulse 98   Temp 98.2 F (36.8 C) (Oral)   Resp 19   Ht 5\' 5"  (1.651 m)   Wt 68 kg (150 lb)   SpO2 98%   BMI 24.96 kg/m   Physical Exam  Constitutional: He appears well-developed and well-nourished. No distress.  Patient is well-appearing, sitting comfortable in bed in no acute distress.    HENT:  Head: Normocephalic and atraumatic.  Eyes: Conjunctivae are normal.  Neck: Neck supple.  Cardiovascular: Normal rate, regular rhythm and normal heart sounds.   No murmur heard. Pulmonary/Chest: Effort normal and breath sounds normal. No respiratory distress. He has no wheezes. He has no rales.  Abdominal: Soft. There is no tenderness.  Musculoskeletal: Normal range of motion. He exhibits no edema.  Neurological: He is alert.  Skin: Skin is warm and dry. No rash noted. He is not diaphoretic. No erythema. No pallor.  Psychiatric: He has a normal mood and affect.  Nursing note and vitals reviewed.    ED Treatments / Results  Labs (all labs ordered are listed, but only abnormal results are displayed) Labs Reviewed - No data to display  EKG  EKG Interpretation None       Radiology No results found.  Procedures Procedures (including critical care time)  Medications Ordered in ED Medications  gi cocktail (Maalox,Lidocaine,Donnatal) (30 mLs Oral Given 06/01/17 0022)     Initial Impression / Assessment and Plan / ED Course  I have reviewed the triage vital signs and the nursing notes.  Pertinent labs & imaging results that were available during my care of the patient were reviewed by me and considered in my medical decision making (see chart for details).    Patient presenting with heartburn and symptoms consistent with acid reflux with prior history of same.  Patient was given a GI cocktail while in the emergency department with significant improvement.  Provided patient with prescription for PPI and urged to follow up with gastroenterology for EGD to evaluate for peptic ulcers.  Patient was well-appearing, nontoxic and afebrile with normal vital signs and stable prior to discharge.  Discussed smoking cessation with patient.  Discussed strict return precautions and advised to return to the emergency department if experiencing any new or worsening symptoms.  Instructions were understood and patient agreed with discharge plan.  Final Clinical Impressions(s) / ED Diagnoses   Final diagnoses:  Gastroesophageal reflux disease, esophagitis presence not specified  History of heartburn    New Prescriptions Discharge Medication List as of 06/01/2017 12:38 AM       Georgiana Shore, PA-C 06/01/17 Leandrew Koyanagi, MD 06/07/17 [phone]

## 2020-02-26 ENCOUNTER — Ambulatory Visit: Payer: Self-pay | Attending: Internal Medicine

## 2020-02-26 ENCOUNTER — Other Ambulatory Visit: Payer: Self-pay

## 2020-02-26 DIAGNOSIS — Z20822 Contact with and (suspected) exposure to covid-19: Secondary | ICD-10-CM | POA: Insufficient documentation

## 2020-02-27 LAB — NOVEL CORONAVIRUS, NAA: SARS-CoV-2, NAA: NOT DETECTED

## 2020-02-27 LAB — SARS-COV-2, NAA 2 DAY TAT

## 2020-06-24 ENCOUNTER — Other Ambulatory Visit: Payer: Self-pay

## 2020-06-24 DIAGNOSIS — Z20822 Contact with and (suspected) exposure to covid-19: Secondary | ICD-10-CM

## 2020-06-26 LAB — SARS-COV-2, NAA 2 DAY TAT

## 2020-06-26 LAB — NOVEL CORONAVIRUS, NAA: SARS-CoV-2, NAA: NOT DETECTED

## 2021-01-25 ENCOUNTER — Emergency Department (HOSPITAL_COMMUNITY): Payer: Self-pay

## 2021-01-25 ENCOUNTER — Encounter (HOSPITAL_COMMUNITY): Payer: Self-pay

## 2021-01-25 ENCOUNTER — Other Ambulatory Visit: Payer: Self-pay

## 2021-01-25 ENCOUNTER — Emergency Department (HOSPITAL_COMMUNITY)
Admission: EM | Admit: 2021-01-25 | Discharge: 2021-01-25 | Disposition: A | Discharge status: Home / Self Care | Payer: Self-pay | Attending: Emergency Medicine | Admitting: Emergency Medicine

## 2021-01-25 DIAGNOSIS — K2100 Gastro-esophageal reflux disease with esophagitis, without bleeding: Secondary | ICD-10-CM | POA: Insufficient documentation

## 2021-01-25 DIAGNOSIS — J40 Bronchitis, not specified as acute or chronic: Secondary | ICD-10-CM | POA: Insufficient documentation

## 2021-01-25 DIAGNOSIS — J45909 Unspecified asthma, uncomplicated: Secondary | ICD-10-CM | POA: Insufficient documentation

## 2021-01-25 DIAGNOSIS — F17210 Nicotine dependence, cigarettes, uncomplicated: Secondary | ICD-10-CM | POA: Insufficient documentation

## 2021-01-25 LAB — COMPREHENSIVE METABOLIC PANEL
ALT: 11 U/L (ref 0–44)
AST: 19 U/L (ref 15–41)
Albumin: 3.8 g/dL (ref 3.5–5.0)
Alkaline Phosphatase: 64 U/L (ref 38–126)
Anion gap: 9 (ref 5–15)
BUN: 9 mg/dL (ref 6–20)
CO2: 27 mmol/L (ref 22–32)
Calcium: 9.1 mg/dL (ref 8.9–10.3)
Chloride: 102 mmol/L (ref 98–111)
Creatinine, Ser: 1.04 mg/dL (ref 0.61–1.24)
GFR, Estimated: >60 mL/min (ref >60)
Glucose, Bld: 140 mg/dL — ABNORMAL HIGH (ref 70–99)
Potassium: 3.6 mmol/L (ref 3.5–5.1)
Sodium: 138 mmol/L (ref 135–145)
Total Bilirubin: 0.3 mg/dL (ref 0.3–1.2)
Total Protein: 7 g/dL (ref 6.5–8.1)

## 2021-01-25 LAB — CBC WITH DIFFERENTIAL/PLATELET
Abs Immature Granulocytes: 0.03 10*3/uL (ref 0.00–0.07)
Basophils Absolute: 0 10*3/uL (ref 0.0–0.1)
Basophils Relative: 0 %
Eosinophils Absolute: 0.4 10*3/uL (ref 0.0–0.5)
Eosinophils Relative: 5 %
HCT: 37.4 % — ABNORMAL LOW (ref 39.0–52.0)
Hemoglobin: 12.5 g/dL — ABNORMAL LOW (ref 13.0–17.0)
Immature Granulocytes: 0 %
Lymphocytes Relative: 31 %
Lymphs Abs: 2.3 10*3/uL (ref 0.7–4.0)
MCH: 29.3 pg (ref 26.0–34.0)
MCHC: 33.4 g/dL (ref 30.0–36.0)
MCV: 87.6 fL (ref 80.0–100.0)
Monocytes Absolute: 0.4 10*3/uL (ref 0.1–1.0)
Monocytes Relative: 5 %
Neutro Abs: 4.2 10*3/uL (ref 1.7–7.7)
Neutrophils Relative %: 59 %
Platelets: 332 10*3/uL (ref 150–400)
RBC: 4.27 MIL/uL (ref 4.22–5.81)
RDW: 13.7 % (ref 11.5–15.5)
WBC: 7.2 10*3/uL (ref 4.0–10.5)
nRBC: 0 % (ref 0.0–0.2)

## 2021-01-25 LAB — LIPASE, BLOOD: Lipase: 24 U/L (ref 11–51)

## 2021-01-25 MED ORDER — BENZONATATE 100 MG PO CAPS
200.0000 mg | ORAL_CAPSULE | Freq: Once | ORAL | Status: AC
  Administered 2021-01-25: 200 mg via ORAL
  Filled 2021-01-25: qty 2

## 2021-01-25 MED ORDER — BENZONATATE 100 MG PO CAPS: 100.0000 mg | ORAL_CAPSULE | Freq: Three times a day (TID) | ORAL | 0 refills | Status: AC

## 2021-01-25 MED ORDER — LIDOCAINE VISCOUS HCL 2 % MT SOLN
15.0000 mL | Freq: Once | OROMUCOSAL | Status: AC
  Administered 2021-01-25: 15 mL via ORAL
  Filled 2021-01-25: qty 15

## 2021-01-25 MED ORDER — ALBUTEROL SULFATE HFA 108 (90 BASE) MCG/ACT IN AERS: 2.0000 | INHALATION_SPRAY | RESPIRATORY_TRACT | 3 refills | Status: AC | PRN

## 2021-01-25 MED ORDER — ALUM & MAG HYDROXIDE-SIMETH 200-200-20 MG/5ML PO SUSP
30.0000 mL | Freq: Once | ORAL | Status: AC
  Administered 2021-01-25: 30 mL via ORAL
  Filled 2021-01-25: qty 30

## 2021-01-25 MED ORDER — FAMOTIDINE 20 MG PO TABS
20.0000 mg | ORAL_TABLET | Freq: Once | ORAL | Status: AC
  Administered 2021-01-25: 20 mg via ORAL
  Filled 2021-01-25: qty 1

## 2021-01-25 NOTE — ED Notes (Signed)
Pt verbalized he took several tums prior to ER visit.

## 2021-01-25 NOTE — Discharge Instructions (Signed)
Your x-ray shows no signs of pneumonia, no pneumothorax, your testing was reassuring and shows no signs of heart disease or lung disease.  You likely have bronchitis which is causing you to have some blood in your sputum.  Please take the following medications as prescribed  Famotidine, 20 mg twice a day for 2 weeks then once a day after that, this will treat your acid reflux and can be obtained over-the-counter  Tessalon, 1 tablet every 8 hours as needed for coughing  Albuterol, 2 puffs every 4 hours as needed for coughing shortness of breath.  See your doctor within 1 week for recheck or emergency department for worsening symptoms

## 2021-01-25 NOTE — ED Triage Notes (Signed)
Pt presented to ED with mid chest pain started yesterday, coughing up blood since fri.

## 2021-01-25 NOTE — ED Provider Notes (Signed)
[facility] EMERGENCY DEPARTMENT Provider Note   CSN: [id number] Arrival date & time: 01/25/21  1504     History Chief Complaint  Patient presents with  . Chest Pain    Louis Scott is a 35 y.o. male.  HPI   This patient is a 35 year old male, history of bipolar disorder, asthma, polysubstance abuse, schizophrenia, he also has a history of acid reflux disease but does not take medications for it.  He reports a chronic history of ongoing acid reflux with a feeling of his esophagus burning and causing some reflux.  He is able to eat food and sometimes feels like it is not going down very easily but he gets it down all the time.  He also reports a recent history of having some chest discomfort with coughing and coughing up some phlegm that is sometimes streaked with blood, this occurs occasionally, it is not frequently, it is not associated with any fevers chills nausea or vomiting, he does not have any diarrhea or swelling of the legs and has no other risk factors for DVT other than tobacco use.  He smokes 1 pack of cigarettes per day.  He is not on anticoagulants, he denies any pain with deep breathing.  He has not been around anybody who has been sick.  Past Medical History:  Diagnosis Date  . Anxiety   . Asthma   . Bipolar disorder (HCC)   . GERD (gastroesophageal reflux disease)   . Hallucination, visual   . Polysubstance abuse (HCC)    narcotics, clean for 5 months since March 2017   . Schizophrenic disorder (HCC)    unsure if this is true diagnosis, then states he was told anxiety     Patient Active Problem List   Diagnosis Date Noted  . Hematemesis 02/11/2016  . GERD (gastroesophageal reflux disease) 12/22/2015  . Dysphagia 12/22/2015  . Rectal bleeding 12/22/2015  . Abdominal pain 12/22/2015  . MDD (major depressive disorder) 03/16/2015  . Polysubstance dependence including opioid type drug, episodic abuse (HCC) 03/14/2015  . Substance induced mood disorder  (HCC) 03/14/2015  . Drug overdose, intentional (HCC) 03/14/2015  . 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 Cannabis abuse 06/18/2012    Past Surgical History:  Procedure Laterality Date  . NO PAST SURGERIES         Family History  Problem Relation Age of Onset  . Colon cancer Neg Hx   . Crohn's disease Neg Hx   . Ulcerative colitis Neg Hx     Social History   Tobacco Use  . Smoking status: Current Every Day Smoker    Packs/day: 1.00    Years: 10.00    Pack years: 10.00    Types: Cigarettes  . Smokeless tobacco: Never Used  Substance Use Topics  . Alcohol use: Not Currently    Alcohol/week: 0.0 standard drinks    Comment: rarely, one beer a month on average  . Drug use: Not Currently    Types: Marijuana, Cocaine    Comment: Last marijuana 3 months ago. Last cocaine over a year ago.- is on suboxone    Home Medications Prior to Admission medications   Medication Sig Start Date End Date Taking? Authorizing Provider  albuterol (VENTOLIN HFA) 108 (90 Base) MCG/ACT inhaler Inhale 2 puffs into the lungs every 4 (four) hours as needed for wheezing or shortness of breath. 01/25/21  Yes Eber HongMiller, Calyn Rubi, MD  benzonatate (TESSALON) 100 MG capsule Take 1 capsule (100 mg total) by mouth every 8 (  eight) hours. 01/25/21  Yes Eber Hong, MD  methadone (DOLOPHINE) 10 MG/5ML solution Take 120 mg by mouth daily.   Yes [provider]  buprenorphine-naloxone (SUBOXONE) 8-2 MG SUBL SL tablet Place 1 tablet under the tongue 2 (two) times daily. Patient not taking: Reported on 01/25/2021    [provider]  omeprazole (PRILOSEC) 20 MG capsule Take 1 capsule (20 mg total) by mouth daily. Patient not taking: No sig reported 05/31/17   Mathews Robinsons B, PA-C  ondansetron (ZOFRAN ODT) 4 MG disintegrating tablet Take 1 tablet (4 mg total) by mouth every 8 (eight) hours as needed for nausea or vomiting. Patient not taking: No sig reported 03/16/16   Everlene Farrier, PA-C   dicyclomine (BENTYL) 20 MG tablet Take 1 tablet (20 mg total) by mouth 3 (three) times daily before meals. As needed for abdominal pain Patient not taking: Reported on 12/26/2014 10/21/14 12/26/14  Ward, Layla Maw, DO  Linaclotide (LINZESS) 145 MCG CAPS capsule Take 1 capsule (145 mcg total) by mouth daily. Take 30 minutes before breakfast 12/22/15 02/10/16  Gelene Mink, NP  ranitidine (ZANTAC) 150 MG capsule Take 1 capsule (150 mg total) by mouth daily. Patient not taking: No sig reported 03/16/16 01/25/21  Everlene Farrier, PA-C    Allergies    Patient has no known allergies.  Review of Systems   Review of Systems  All other systems reviewed and are negative.   Physical Exam Updated Vital Signs BP 131/70   Pulse 78   Temp 98.2 F (36.8 C) (Oral)   Resp 12   Ht 1.6 m (5\' 3" )   Wt 81.6 kg   SpO2 99%   BMI 31.89 kg/m   Physical Exam Vitals and nursing note reviewed.  Constitutional:      General: He is not in acute distress.    Appearance: He is well-developed.  HENT:     Head: Normocephalic and atraumatic.     Mouth/Throat:     Pharynx: No oropharyngeal exudate.  Eyes:     General: No scleral icterus.       Right eye: No discharge.        Left eye: No discharge.     Conjunctiva/sclera: Conjunctivae normal.     Pupils: Pupils are equal, round, and reactive to light.  Neck:     Thyroid: No thyromegaly.     Vascular: No JVD.  Cardiovascular:     Rate and Rhythm: Normal rate and regular rhythm.     Heart sounds: Normal heart sounds. No murmur heard. No friction rub. No gallop.   Pulmonary:     Effort: Pulmonary effort is normal. No respiratory distress.     Breath sounds: Normal breath sounds. No wheezing or rales.  Abdominal:     General: Bowel sounds are normal. There is no distension.     Palpations: Abdomen is soft. There is no mass.     Tenderness: There is abdominal tenderness.     Comments: Mild to moderate right upper quadrant tenderness to palpation, no  guarding no Murphy sign no right lower quadrant tenderness, no peritoneal signs  Musculoskeletal:        General: No tenderness. Normal range of motion.     Cervical back: Normal range of motion and neck supple.  Lymphadenopathy:     Cervical: No cervical adenopathy.  Skin:    General: Skin is warm and dry.     Findings: No erythema or rash.  Neurological:     Mental  Status: He is alert.     Coordination: Coordination normal.  Psychiatric:        Behavior: Behavior normal.     ED Results / Procedures / Treatments   Labs (all labs ordered are listed, but only abnormal results are displayed) Labs Reviewed  CBC WITH DIFFERENTIAL/PLATELET - Abnormal; Notable for the following components:      Result Value   Hemoglobin 12.5 (*)    HCT 37.4 (*)    All other components within normal limits  COMPREHENSIVE METABOLIC PANEL - Abnormal; Notable for the following components:   Glucose, Bld 140 (*)    All other components within normal limits  LIPASE, BLOOD    EKG EKG Interpretation  Date/Time:  Tuesday January 25 2021 15:23:42 EDT Ventricular Rate:  94 PR Interval:  190 QRS Duration: 108 QT Interval:  347 QTC Calculation: 434 R Axis:   56 Text Interpretation: Sinus rhythm Right atrial enlargement Consider right ventricular hypertrophy since last tracing no significant change Confirmed by Eber Hong ([id number]) on 01/25/2021 3:44:29 PM   Radiology DG Chest 2 View  Result Date: 01/25/2021 CLINICAL DATA:  Hemoptysis.  Chest pain. EXAM: CHEST - 2 VIEW COMPARISON:  10/20/2014 FINDINGS: Cardiac and mediastinal contours normal. Vascularity normal. Negative for heart failure. Negative for infiltrate or effusion. No skeletal abnormality. IMPRESSION: No active cardiopulmonary disease. Electronically Signed   By: Marlan Palau M.D.   On: 01/25/2021 16:47   US Abdomen Limited  Result Date: 01/25/2021 CLINICAL DATA:  Right upper quadrant pain EXAM: ULTRASOUND ABDOMEN LIMITED RIGHT UPPER  QUADRANT COMPARISON:  CT 10/20/2014 FINDINGS: Gallbladder: No gallstones or wall thickening visualized. No sonographic Murphy sign noted by sonographer. Common bile duct: Diameter: 4.6 mm Liver: Liver is slightly echogenic. No focal hepatic abnormality. Portal vein is patent on color Doppler imaging with normal direction of blood flow towards the liver. Other: None. IMPRESSION: 1. Negative for gallstones. 2. Slightly echogenic liver suggesting steatosis Electronically Signed   By: Jasmine Pang M.D.   On: 01/25/2021 17:28    Procedures Procedures   Medications Ordered in ED Medications  benzonatate (TESSALON) capsule 200 mg (200 mg Oral Given 01/25/21 1555)  famotidine (PEPCID) tablet 20 mg (20 mg Oral Given 01/25/21 1555)  alum & mag hydroxide-simeth (MAALOX/MYLANTA) 200-200-20 MG/5ML suspension 30 mL (30 mLs Oral Given 01/25/21 1555)    And  lidocaine (XYLOCAINE) 2 % viscous mouth solution 15 mL (15 mLs Oral Given 01/25/21 1556)    ED Course  I have reviewed the triage vital signs and the nursing notes.  Pertinent labs & imaging results that were available during my care of the patient were reviewed by me and considered in my medical decision making (see chart for details).    MDM Rules/Calculators/A&P                          This patient symptoms could be consistent with multiple etiologies of bronchitis with some mild hemoptysis is the most likely answer.  He does smoke, he has a infrequent, mostly nonproductive cough which sometimes has some blood-streaked sputum.  He also has some tenderness in the right upper quadrant which seems to be getting worse over time, he does have a history of acid reflux and feels like the symptoms are getting worse as well.  His EKG is nonischemic and unchanged, give GI cocktail, check right upper quadrant ultrasound and obtain a chest x-ray to make sure there is no signs of pneumothorax.  The patient is agreeable to the plan.  Vital signs are  unremarkable.  Vitals:   01/25/21 1528 01/25/21 1529 01/25/21 1530  BP:   (!) 126/100  Pulse:   96  Resp:   20  Temp: 98.2 F (36.8 C)    TempSrc: Oral    SpO2:   99%  Weight:  81.6 kg   Height:  1.6 m (5\' 3" )    and cxr neg - labs reassuring Stable for d/c Pt informed and given meds for bronchitis for home.  Final Clinical Impression(s) / ED Diagnoses Final diagnoses:  Gastroesophageal reflux disease with esophagitis without hemorrhage  Bronchitis    Rx / DC Orders ED Discharge Orders         Ordered    benzonatate (TESSALON) 100 MG capsule  Every 8 hours        01/25/21 1747    albuterol (VENTOLIN HFA) 108 (90 Base) MCG/ACT inhaler  Every 4 hours PRN        01/25/21 1747           01/27/21, MD 01/25/21 [phone]

## 2021-03-05 ENCOUNTER — Encounter (HOSPITAL_COMMUNITY): Payer: Self-pay

## 2021-03-05 ENCOUNTER — Emergency Department (HOSPITAL_COMMUNITY)
Admission: EM | Admit: 2021-03-05 | Discharge: 2021-03-05 | Disposition: A | Discharge status: Home / Self Care | Payer: Self-pay | Attending: Emergency Medicine | Admitting: Emergency Medicine

## 2021-03-05 ENCOUNTER — Other Ambulatory Visit: Payer: Self-pay

## 2021-03-05 DIAGNOSIS — F17210 Nicotine dependence, cigarettes, uncomplicated: Secondary | ICD-10-CM | POA: Insufficient documentation

## 2021-03-05 DIAGNOSIS — J45909 Unspecified asthma, uncomplicated: Secondary | ICD-10-CM | POA: Insufficient documentation

## 2021-03-05 DIAGNOSIS — K0889 Other specified disorders of teeth and supporting structures: Secondary | ICD-10-CM | POA: Insufficient documentation

## 2021-03-05 MED ORDER — PENICILLIN V POTASSIUM 500 MG PO TABS: 500.0000 mg | ORAL_TABLET | Freq: Four times a day (QID) | ORAL | 0 refills | Status: AC

## 2021-03-05 MED ORDER — PENICILLIN V POTASSIUM 250 MG PO TABS
500.0000 mg | ORAL_TABLET | Freq: Once | ORAL | Status: AC
  Administered 2021-03-05: 500 mg via ORAL
  Filled 2021-03-05: qty 2

## 2021-03-05 MED ORDER — NAPROXEN 500 MG PO TABS: 500.0000 mg | ORAL_TABLET | Freq: Two times a day (BID) | ORAL | 0 refills | Status: DC | PRN

## 2021-03-05 MED ORDER — NAPROXEN 250 MG PO TABS
500.0000 mg | ORAL_TABLET | Freq: Once | ORAL | Status: AC
  Administered 2021-03-05: 500 mg via ORAL
  Filled 2021-03-05: qty 2

## 2021-03-05 NOTE — ED Triage Notes (Signed)
Pt arrives via POV from home c/o lower right side dental pain X 1 day. Pt woke up from sleep with pain. Swelling present on right face.

## 2021-03-05 NOTE — Discharge Instructions (Signed)
Choose a dentist  from the attached list.  Take the antibiotics as prescribed and use warm compresses.  Return to the ED with difficulty breathing, difficulty swallowing or any other concerns

## 2021-03-05 NOTE — ED Provider Notes (Signed)
[facility] EMERGENCY DEPARTMENT Provider Note   CSN: [id number] Arrival date & time: 03/05/21  0423     History Chief Complaint  Patient presents with  . Dental Pain    Louis Scott is a 35 y.o. male.  Patient presents with right-sided lower dental pain since about 2 AM.  States states he notes his teeth are in very poor condition and he occasionally has problems with them.  He had increased pain and swelling tonight without discrete injury.  Denies difficulty breathing or difficulty swallowing.  No fevers, chills, nausea or vomiting.  States he has not been able to make it to a dentist for quite some time.  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  Denies IV drug abuse.  Denies any chest pain or shortness of breath.  Denies any nausea or vomiting.  The history is provided by the patient.  Dental Pain Associated symptoms: no congestion and no fever        Past Medical History:  Diagnosis Date  . Anxiety   . Asthma   . Bipolar disorder (HCC)   . GERD (gastroesophageal reflux disease)   . Hallucination, visual   . Polysubstance abuse (HCC)    narcotics, clean for 5 months since March 2017   . Schizophrenic disorder (HCC)    unsure if this is true diagnosis, then states he was told anxiety     Patient Active Problem List   Diagnosis Date Noted  . Hematemesis 02/11/2016  . GERD (gastroesophageal reflux disease) 12/22/2015  . Dysphagia 12/22/2015  . Rectal bleeding 12/22/2015  . Abdominal pain 12/22/2015  . MDD (major depressive disorder) 03/16/2015  . Polysubstance dependence including opioid type drug, episodic abuse (HCC) 03/14/2015  . Substance induced mood disorder (HCC) 03/14/2015  . Drug overdose, intentional (HCC) 03/14/2015  . 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 Cannabis abuse 06/18/2012    Past Surgical History:  Procedure Laterality Date  . NO PAST SURGERIES         Family History  Problem Relation Age  of Onset  . Colon cancer Neg Hx   . Crohn's disease Neg Hx   . Ulcerative colitis Neg Hx     Social History   Tobacco Use  . Smoking status: Current Every Day Smoker    Packs/day: 1.00    Years: 10.00    Pack years: 10.00    Types: Cigarettes  . Smokeless tobacco: Never Used  Vaping Use  . Vaping Use: Never used  Substance Use Topics  . Alcohol use: Not Currently    Alcohol/week: 0.0 standard drinks    Comment: rarely, one beer a month on average  . Drug use: Not Currently    Types: Marijuana, Cocaine    Comment: Last marijuana 3 months ago. Last cocaine over a year ago.- is on suboxone    Home Medications Prior to Admission medications   Medication Sig Start Date End Date Taking? Authorizing Provider  albuterol (VENTOLIN HFA) 108 (90 Base) MCG/ACT inhaler Inhale 2 puffs into the lungs every 4 (four) hours as needed for wheezing or shortness of breath. 01/25/21   Eber Hong, MD  benzonatate (TESSALON) 100 MG capsule Take 1 capsule (100 mg total) by mouth every 8 (eight) hours. 01/25/21   Eber Hong, MD  buprenorphine-naloxone (SUBOXONE) 8-2 MG SUBL SL tablet Place 1 tablet under the tongue 2 (two) times daily. Patient not taking: Reported on 01/25/2021    [provider]  methadone (DOLOPHINE) 10  MG/5ML solution Take 120 mg by mouth daily.    [provider]  omeprazole (PRILOSEC) 20 MG capsule Take 1 capsule (20 mg total) by mouth daily. Patient not taking: No sig reported 05/31/17   Mathews Robinsons B, PA-C  ondansetron (ZOFRAN ODT) 4 MG disintegrating tablet Take 1 tablet (4 mg total) by mouth every 8 (eight) hours as needed for nausea or vomiting. Patient not taking: No sig reported 03/16/16   Everlene Farrier, PA-C  dicyclomine (BENTYL) 20 MG tablet Take 1 tablet (20 mg total) by mouth 3 (three) times daily before meals. As needed for abdominal pain Patient not taking: Reported on 12/26/2014 10/21/14 12/26/14  Ward, Layla Maw, DO  Linaclotide (LINZESS) 145  MCG CAPS capsule Take 1 capsule (145 mcg total) by mouth daily. Take 30 minutes before breakfast 12/22/15 02/10/16  Gelene Mink, NP  ranitidine (ZANTAC) 150 MG capsule Take 1 capsule (150 mg total) by mouth daily. Patient not taking: No sig reported 03/16/16 01/25/21  Everlene Farrier, PA-C    Allergies    Patient has no known allergies.  Review of Systems   Review of Systems  Constitutional: Negative for activity change, appetite change, fatigue and fever.  HENT: Positive for dental problem. Negative for congestion.   Respiratory: Negative for cough, chest tightness and shortness of breath.   Cardiovascular: Negative for chest pain.  Gastrointestinal: Negative for abdominal pain, nausea and vomiting.  Genitourinary: Negative for dysuria and hematuria.  Musculoskeletal: Negative for arthralgias and myalgias.  Skin: Negative for rash.  Neurological: Negative for dizziness and weakness.   all other systems are negative except as noted in the HPI and PMH.    Physical Exam Updated Vital Signs BP (!) 146/79 (BP Location: Right Arm)   Pulse 94   Temp 97.7 F (36.5 C) (Oral)   Resp 16   Ht 5\' 4"  (1.626 m)   Wt 90.7 kg   SpO2 95%   BMI 34.33 kg/m   Physical Exam Vitals and nursing note reviewed.  Constitutional:      General: He is not in acute distress.    Appearance: He is well-developed.     Comments: Mild right-sided facial swelling  HENT:     Head: Normocephalic and atraumatic.     Mouth/Throat:     Pharynx: No oropharyngeal exudate.     Comments: Dentition in poor repair throughout.  Multiple caries multiple teeth broken off at the gumline.  There is induration along the right lower gingiva across the premolars but no fluctuance.  Floor mouth is soft.  No trismus or malocclusion. Eyes:     Conjunctiva/sclera: Conjunctivae normal.     Pupils: Pupils are equal, round, and reactive to light.  Neck:     Comments: No meningismus. Cardiovascular:     Rate and Rhythm: Normal  rate and regular rhythm.     Heart sounds: Normal heart sounds. No murmur heard.   Pulmonary:     Effort: Pulmonary effort is normal. No respiratory distress.     Breath sounds: Normal breath sounds.  Abdominal:     Palpations: Abdomen is soft.     Tenderness: There is no abdominal tenderness. There is no guarding or rebound.  Musculoskeletal:        General: No tenderness. Normal range of motion.     Cervical back: Normal range of motion and neck supple.  Skin:    General: Skin is warm.  Neurological:     Mental Status: He is alert and oriented  to person, place, and time.     Cranial Nerves: No cranial nerve deficit.     Motor: No abnormal muscle tone.     Coordination: Coordination normal.     Comments: No ataxia on finger to nose bilaterally. No pronator drift. 5/5 strength throughout. CN 2-12 intact.Equal grip strength. Sensation intact.   Psychiatric:        Behavior: Behavior normal.     ED Results / Procedures / Treatments   Labs (all labs ordered are listed, but only abnormal results are displayed) Labs Reviewed - No data to display  EKG None  Radiology No results found.  Procedures Procedures   Medications Ordered in ED Medications  naproxen (NAPROSYN) tablet 500 mg (has no administration in time range)  penicillin v potassium (VEETID) tablet 500 mg (has no administration in time range)    ED Course  I have reviewed the triage vital signs and the nursing notes.  Pertinent labs & imaging results that were available during my care of the patient were reviewed by me and considered in my medical decision making (see chart for details).    MDM Rules/Calculators/A&P                         Dental pain with very poor dentition and likely early dental abscess.  No drainable fluid collection at this time as no fluctuance on exam.   Will give nonnarcotic pain medication, warm compresses and antibiotics.  Discussed dental follow-up.  Return to the ED with  worsening symptoms including difficulty breathing or difficulty swallowing or any other concerns. Final Clinical Impression(s) / ED Diagnoses Final diagnoses:  Pain, dental    Rx / DC Orders ED Discharge Orders    None       Kortne All, Jeannett Senior, MD 03/05/21 [phone]

## 2021-04-28 IMAGING — US US ABDOMEN LIMITED RUQ/ASCITES
1 series · 14 of 25 positions shown · non-contrast
Comparison: CT 10/20/2014

CLINICAL DATA: Right upper quadrant pain

EXAM:
ULTRASOUND ABDOMEN LIMITED RIGHT UPPER QUADRANT

[Series 1: us abdomen limited · 14 of 52 slices shown]
[im 1/52]
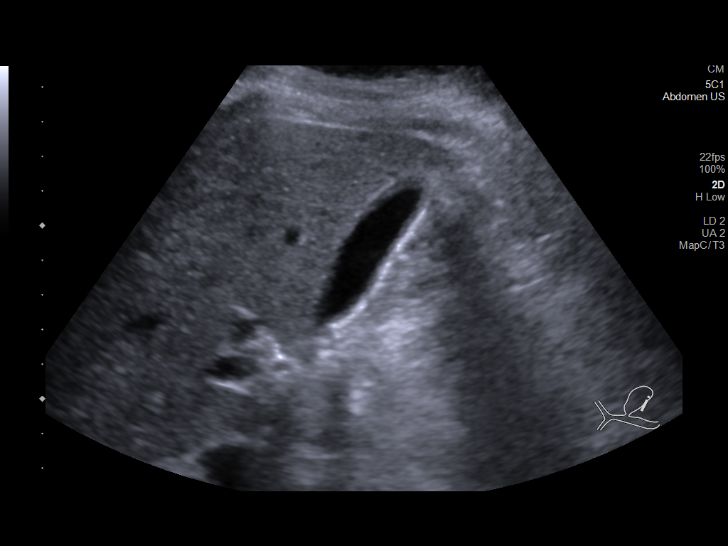
[im 5/52]
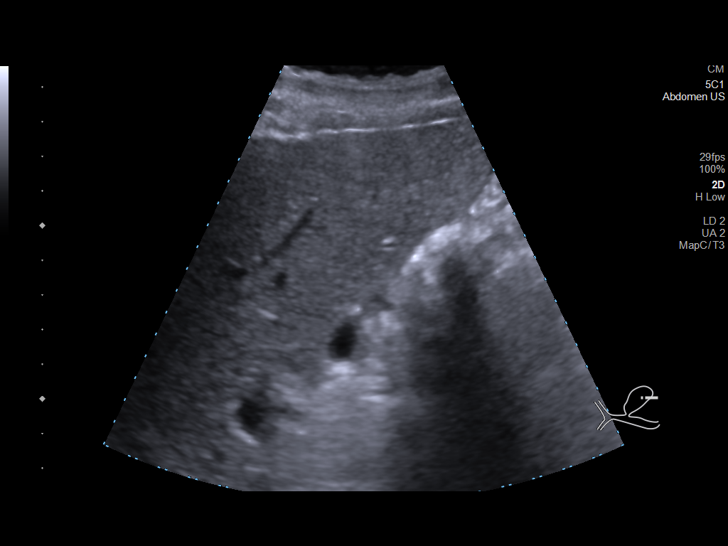
[im 9/52]
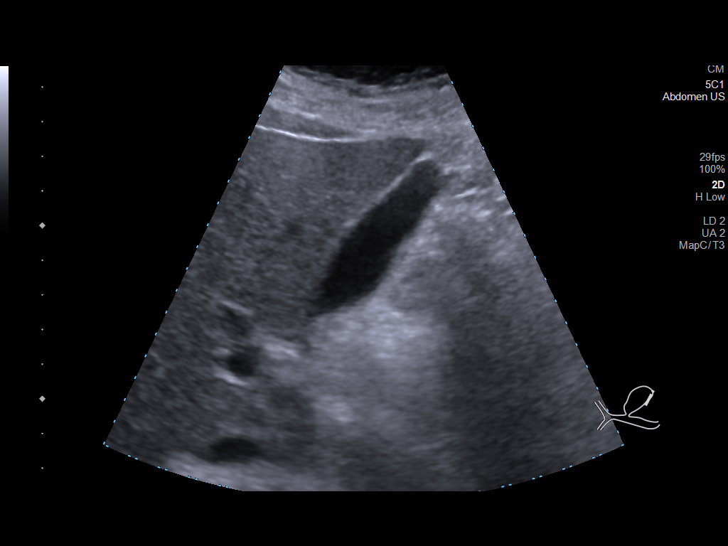
[im 13/52]
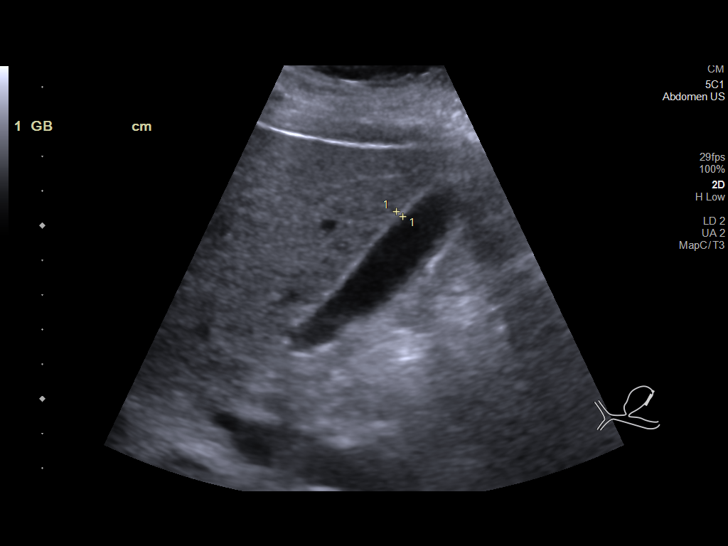
[im 18/52]
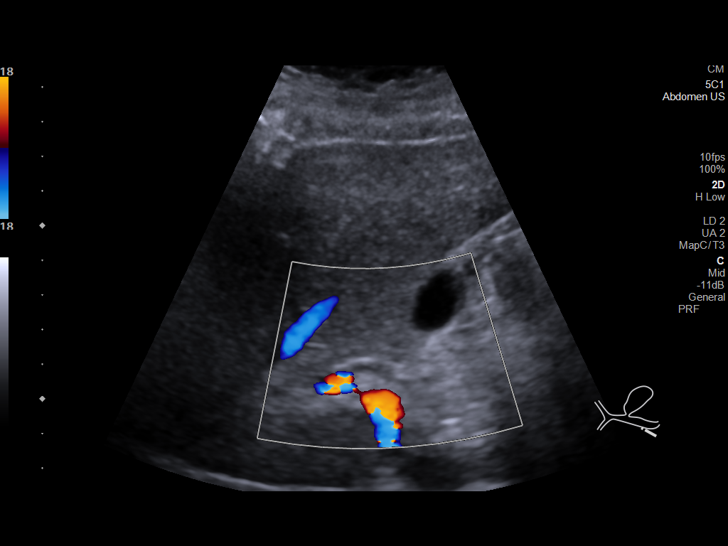
[im 20/52]
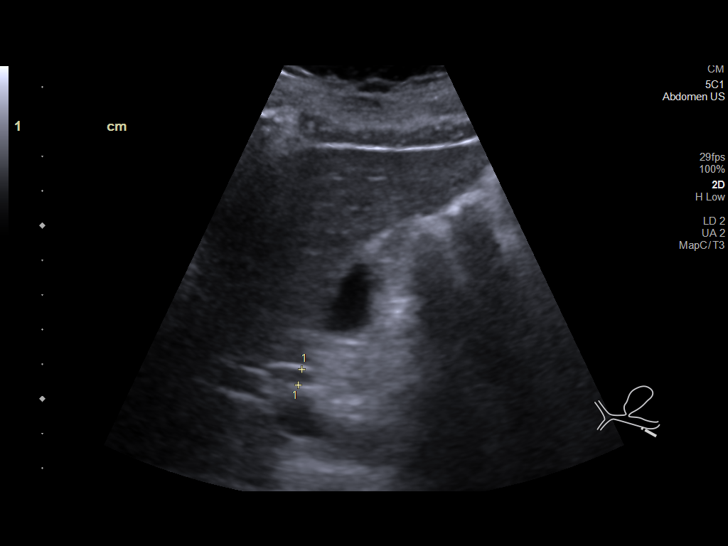
[im 24/52]
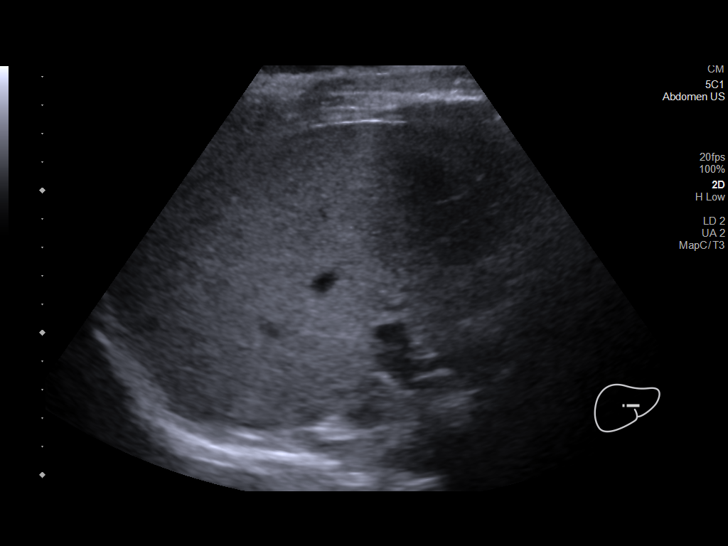
[im 28/52]
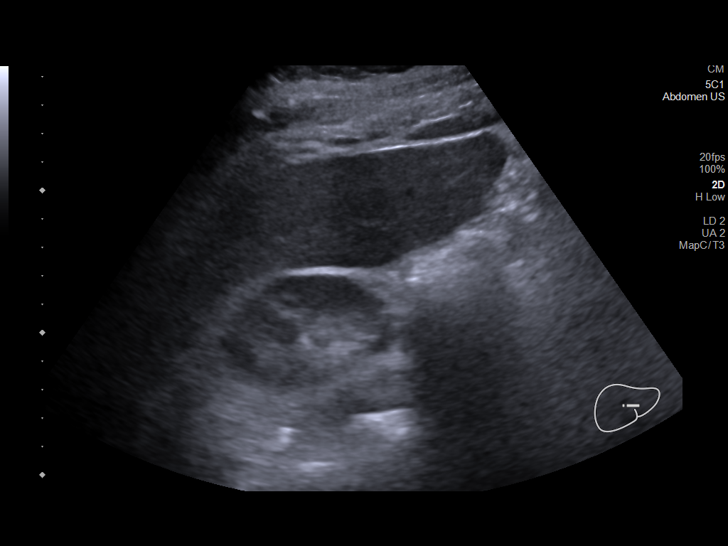
[im 32/52]
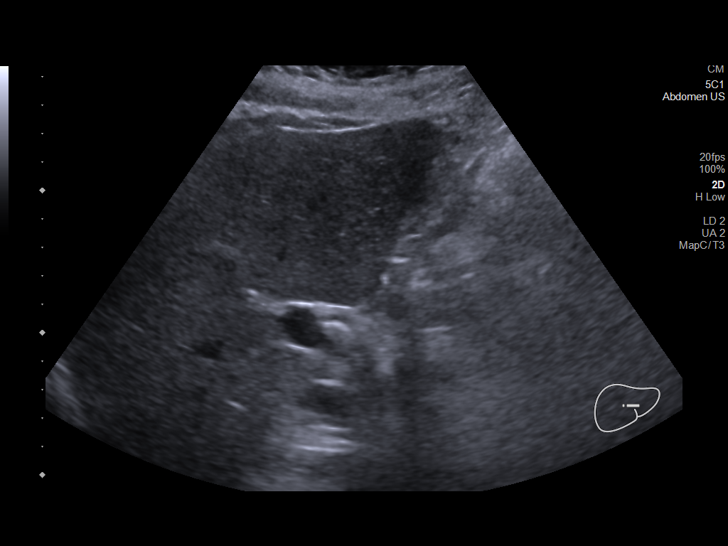
[im 35/52]
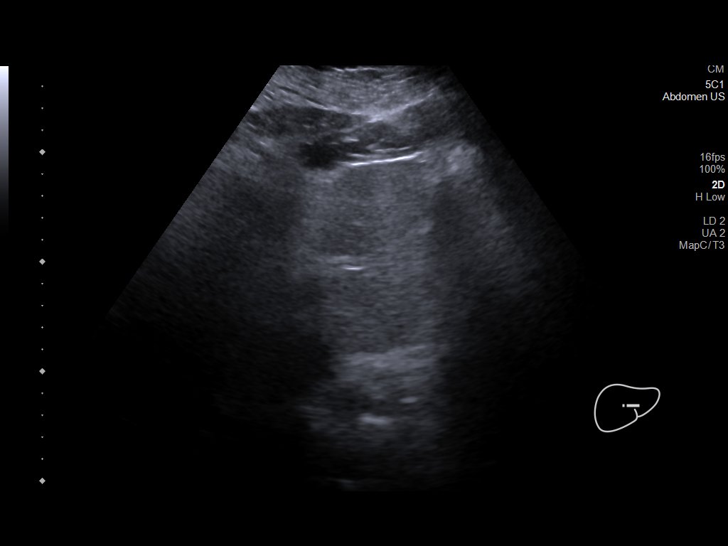
[im 39/52]
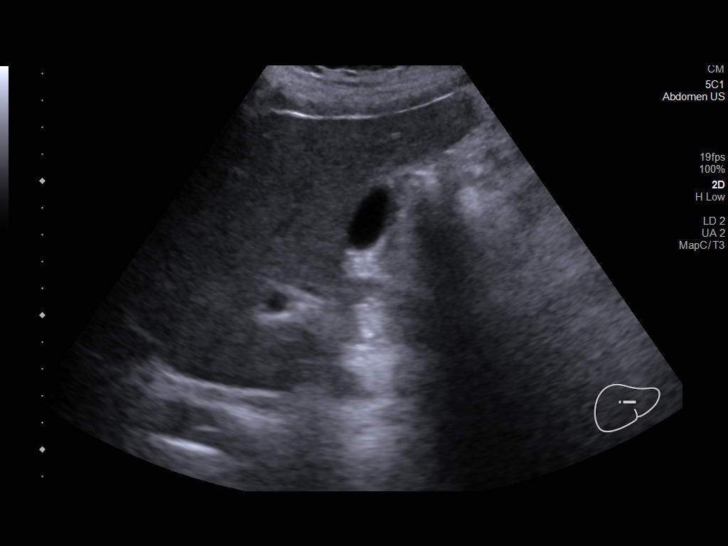
[im 43/52]
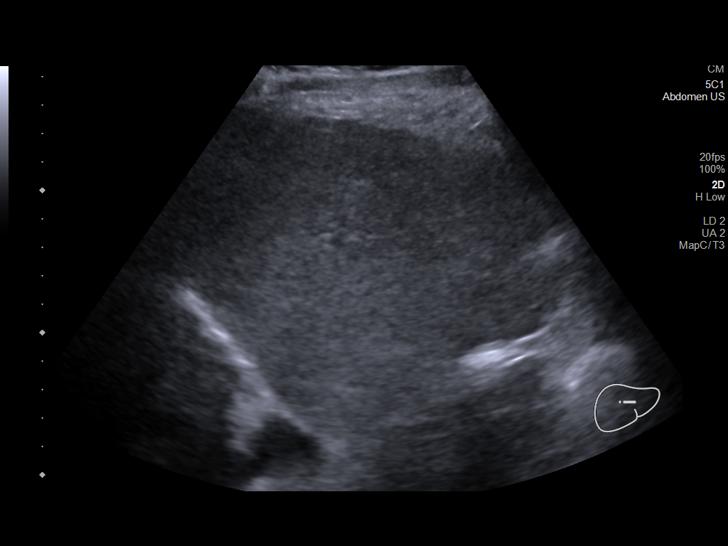
[im 47/52]
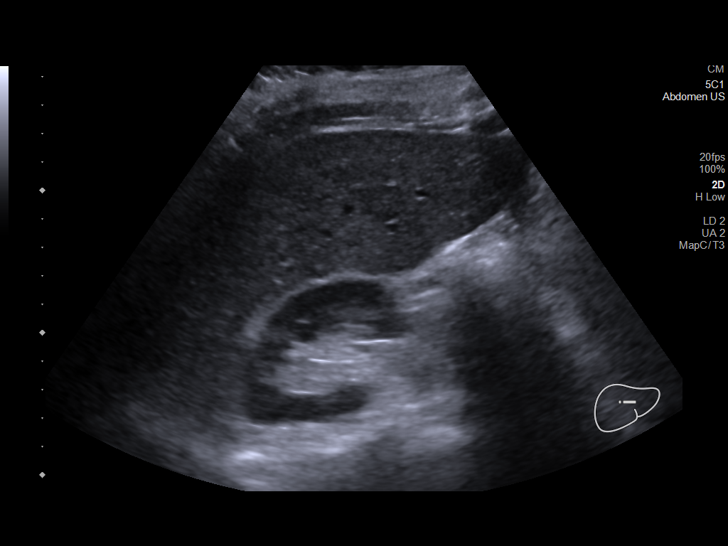
[im 52/52]
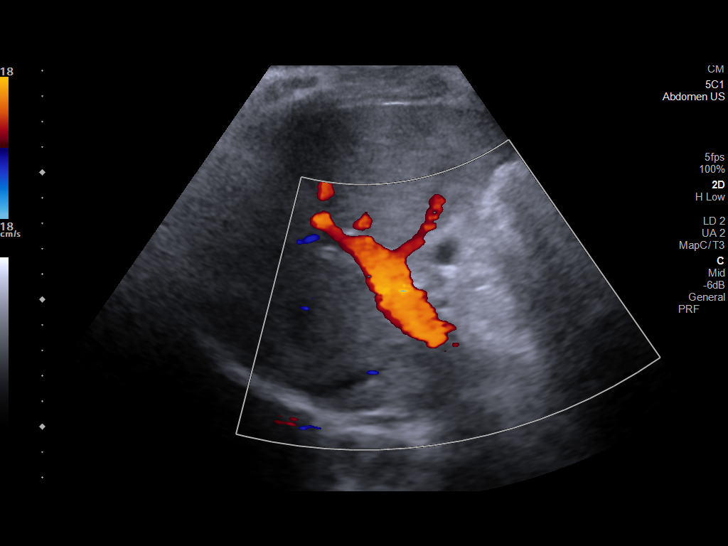

[14 of 25 positions shown; findings below may reference images not displayed]

FINDINGS: Gallbladder:

No gallstones or wall thickening visualized. No sonographic Murphy
sign noted by sonographer.

Common bile duct:

Diameter: 4.6 mm

Liver:

Liver is slightly echogenic. No focal hepatic abnormality. Portal
vein is patent on color Doppler imaging with normal direction of
blood flow towards the liver.

Other: None.
IMPRESSION: 1. Negative for gallstones.
2. Slightly echogenic liver suggesting steatosis

## 2021-04-28 IMAGING — DX DG CHEST 2V
2 series · 2 of 2 positions shown · non-contrast
Comparison: 10/20/2014

CLINICAL DATA: Hemoptysis.  Chest pain.

EXAM:
CHEST - 2 VIEW

[chest pa]
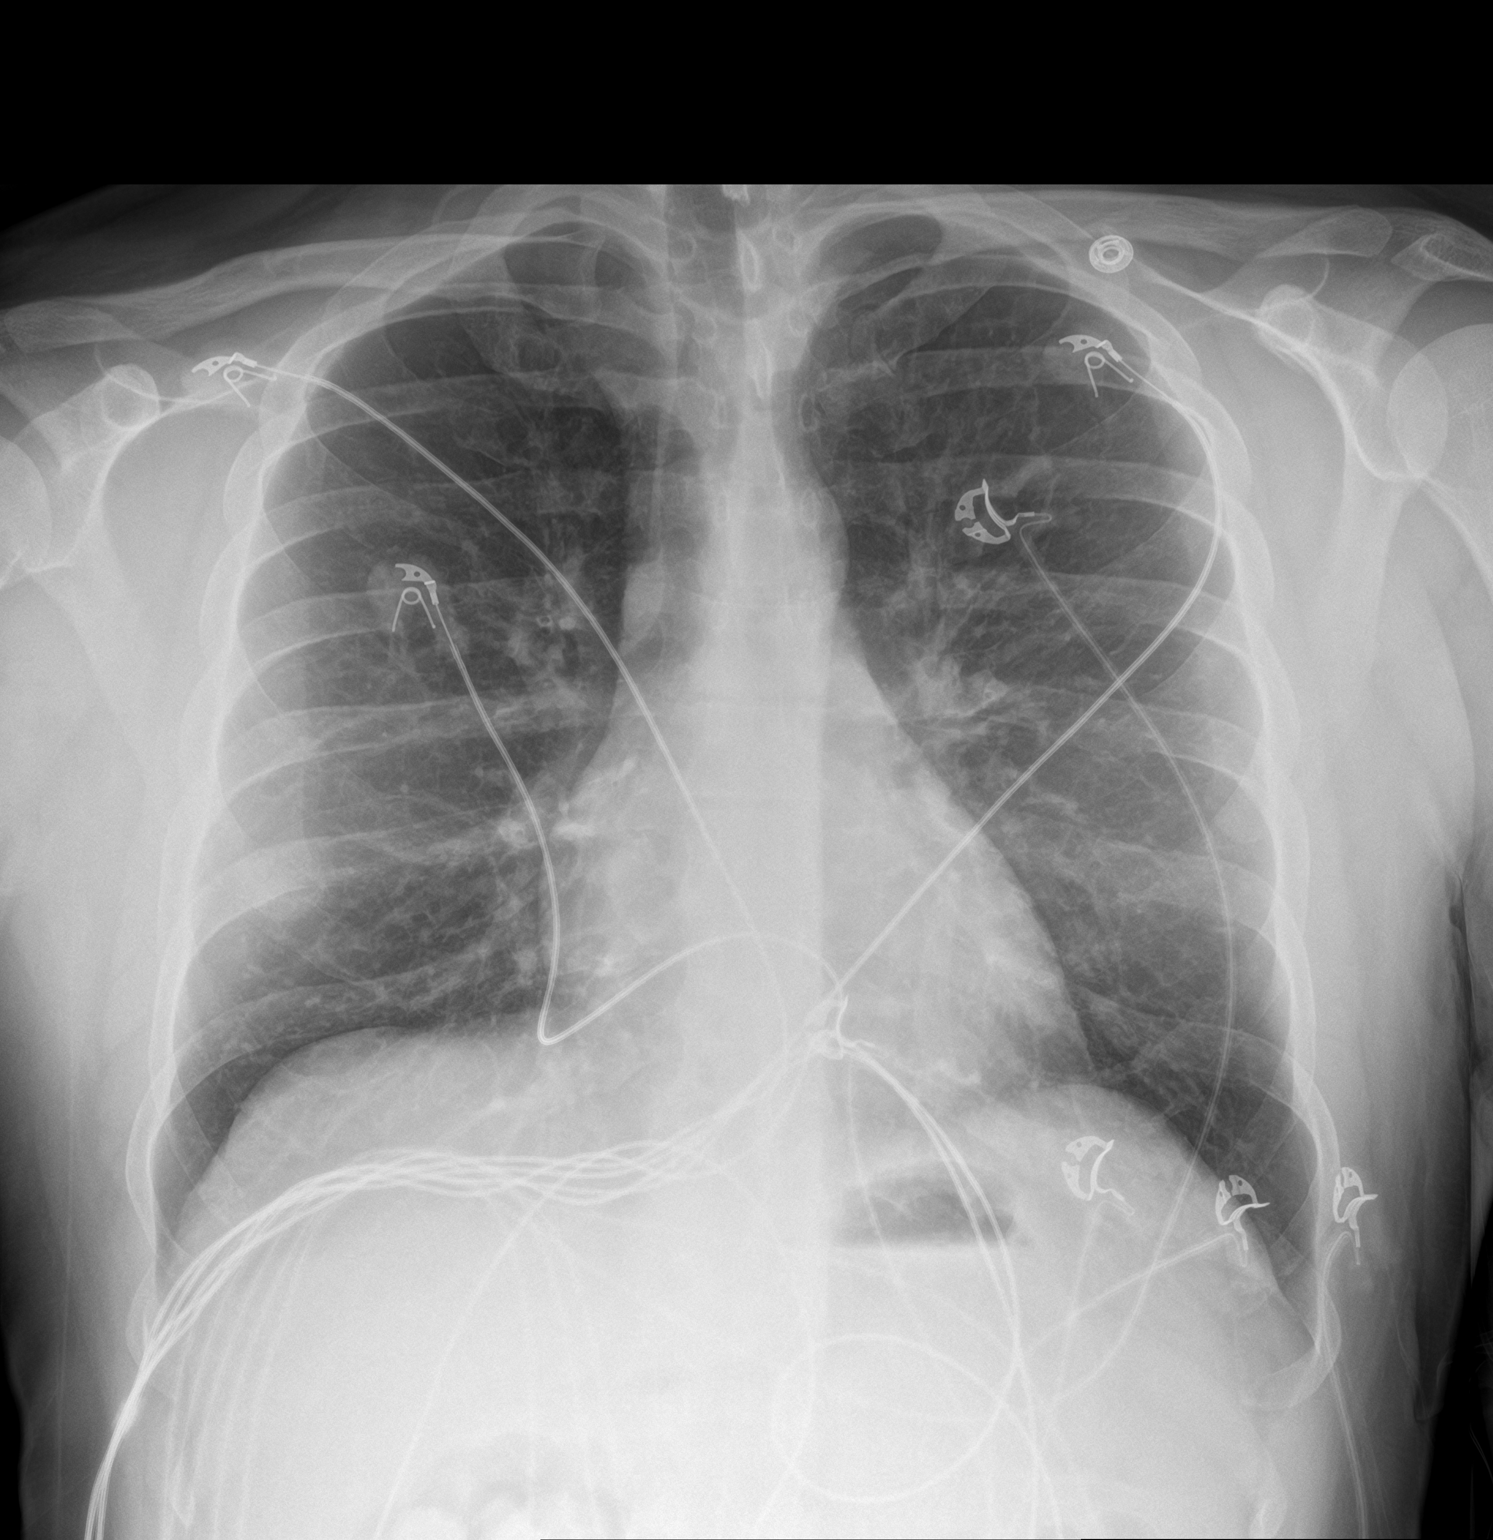

[chest lat]
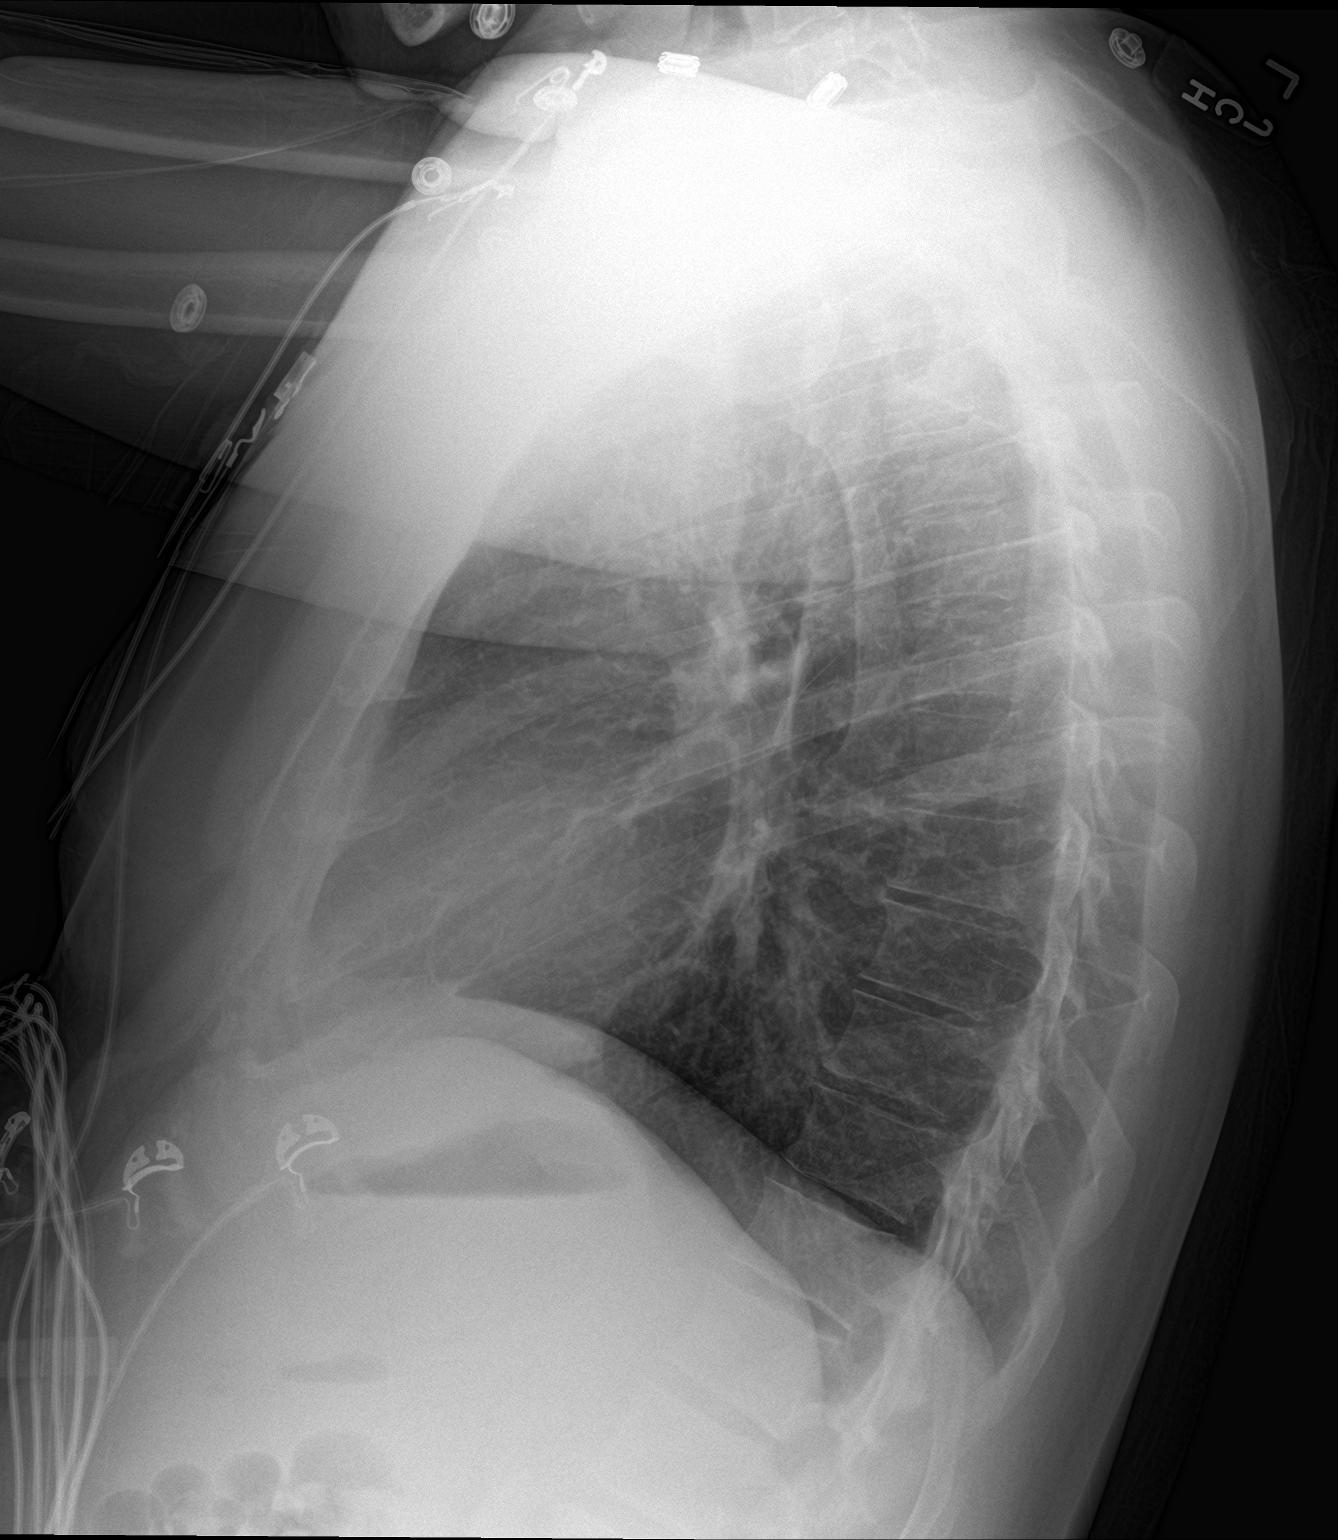

[2 of 2 positions shown; findings below may reference images not displayed]

FINDINGS: Cardiac and mediastinal contours normal. Vascularity normal.
Negative for heart failure. Negative for infiltrate or effusion. No
skeletal abnormality.
IMPRESSION: No active cardiopulmonary disease.

## 2022-12-18 ENCOUNTER — Emergency Department (HOSPITAL_COMMUNITY): Payer: Self-pay

## 2022-12-18 ENCOUNTER — Other Ambulatory Visit: Payer: Self-pay

## 2022-12-18 ENCOUNTER — Emergency Department (HOSPITAL_COMMUNITY)
Admission: EM | Admit: 2022-12-18 | Discharge: 2022-12-19 | Disposition: A | Discharge status: Home / Self Care | Payer: Self-pay | Attending: Emergency Medicine | Admitting: Emergency Medicine

## 2022-12-18 DIAGNOSIS — S0100XA Unspecified open wound of scalp, initial encounter: Secondary | ICD-10-CM | POA: Insufficient documentation

## 2022-12-18 DIAGNOSIS — S20419A Abrasion of unspecified back wall of thorax, initial encounter: Secondary | ICD-10-CM | POA: Diagnosis not present

## 2022-12-18 DIAGNOSIS — Z23 Encounter for immunization: Secondary | ICD-10-CM | POA: Insufficient documentation

## 2022-12-18 DIAGNOSIS — Y92410 Unspecified street and highway as the place of occurrence of the external cause: Secondary | ICD-10-CM | POA: Insufficient documentation

## 2022-12-18 DIAGNOSIS — S0990XA Unspecified injury of head, initial encounter: Secondary | ICD-10-CM | POA: Diagnosis present
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
## 2022-12-19 MED ORDER — TETANUS-DIPHTH-ACELL PERTUSSIS 5-2.5-18.5 LF-MCG/0.5 IM SUSY
0.5000 mL | PREFILLED_SYRINGE | Freq: Once | INTRAMUSCULAR | Status: AC
  Administered 2022-12-19: 0.5 mL via INTRAMUSCULAR
  Filled 2022-12-19: qty 0.5

## 2022-12-19 MED ORDER — NAPROXEN 250 MG PO TABS
500.0000 mg | ORAL_TABLET | Freq: Once | ORAL | Status: AC
  Administered 2022-12-19: 500 mg via ORAL
  Filled 2022-12-19: qty 2

## 2022-12-19 MED ORDER — NAPROXEN 500 MG PO TABS: 500.0000 mg | ORAL_TABLET | Freq: Two times a day (BID) | ORAL | 0 refills | Status: AC | PRN

## 2022-12-19 MED ORDER — METHOCARBAMOL 500 MG PO TABS
500.0000 mg | ORAL_TABLET | Freq: Once | ORAL | Status: AC
  Administered 2022-12-19: 500 mg via ORAL
  Filled 2022-12-19: qty 1

## 2022-12-19 MED ORDER — METHOCARBAMOL 500 MG PO TABS: 500.0000 mg | ORAL_TABLET | Freq: Two times a day (BID) | ORAL | 0 refills | Status: AC | PRN
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
## 2022-12-19 NOTE — Discharge Instructions (Addendum)
Your workup in the ED today was reassuring.  Your head CT and neck CT did not show any evidence of acute injury.  Take naproxen and Robaxin as prescribed for management of any residual pain/soreness/muscle spasm.  We recommend alternating ice and heat to areas of injury 3-4 times per day to help lessen inflammation and spasm.  You had a normal neurologic exam while in the emergency department.  This is reassuring.  It is possible that you may have a mild concussion.  A concussion is a diagnosis that is made clinically and does not show any abnormal results on imaging such as a CT scan or MRI.    A concussion may cause a persistent headache over the next few days. This can be brought on or worsened by loud sounds or bright lights. Try to avoid excessive use of cell phones, television, video games as this may worsening headaches.  Avoid strenuous activity and heavy lifting over the next few days.  If you develop severe worsening of your headache, vision changes or loss, uncontrolled vomiting, numbness or tingling to one side of your body, difficulty walking or lifting your arms or legs, return promptly to the emergency department for repeat evaluation.

## 2023-01-29 ENCOUNTER — Emergency Department (HOSPITAL_COMMUNITY): Admission: EM | Admit: 2023-01-29 | Discharge: 2023-01-29 | Discharge status: Against Medical Advice | Payer: Self-pay

## 2023-01-29 NOTE — ED Notes (Signed)
Not in waiting area x 2 

## 2023-09-14 ENCOUNTER — Emergency Department (HOSPITAL_COMMUNITY): Payer: Self-pay

## 2023-09-14 ENCOUNTER — Encounter (HOSPITAL_COMMUNITY): Payer: Self-pay | Admitting: *Deleted

## 2023-09-14 ENCOUNTER — Other Ambulatory Visit: Payer: Self-pay

## 2023-09-14 ENCOUNTER — Emergency Department (HOSPITAL_COMMUNITY)
Admission: EM | Admit: 2023-09-14 | Discharge: 2023-09-14 | Disposition: A | Discharge status: Home / Self Care | Payer: Self-pay | Attending: Emergency Medicine | Admitting: Emergency Medicine

## 2023-09-14 DIAGNOSIS — Z20822 Contact with and (suspected) exposure to covid-19: Secondary | ICD-10-CM | POA: Insufficient documentation

## 2023-09-14 DIAGNOSIS — Z7951 Long term (current) use of inhaled steroids: Secondary | ICD-10-CM | POA: Insufficient documentation

## 2023-09-14 DIAGNOSIS — J45901 Unspecified asthma with (acute) exacerbation: Secondary | ICD-10-CM | POA: Insufficient documentation

## 2023-09-14 LAB — CBC WITH DIFFERENTIAL/PLATELET
Abs Immature Granulocytes: 0.03 10*3/uL (ref 0.00–0.07)
Basophils Absolute: 0 10*3/uL (ref 0.0–0.1)
Basophils Relative: 0 %
Eosinophils Absolute: 0.9 10*3/uL — ABNORMAL HIGH (ref 0.0–0.5)
Eosinophils Relative: 9 %
HCT: 33.3 % — ABNORMAL LOW (ref 39.0–52.0)
Hemoglobin: 11.5 g/dL — ABNORMAL LOW (ref 13.0–17.0)
Immature Granulocytes: 0 %
Lymphocytes Relative: 16 %
Lymphs Abs: 1.8 10*3/uL (ref 0.7–4.0)
MCH: 30.3 pg (ref 26.0–34.0)
MCHC: 34.5 g/dL (ref 30.0–36.0)
MCV: 87.6 fL (ref 80.0–100.0)
Monocytes Absolute: 0.7 10*3/uL (ref 0.1–1.0)
Monocytes Relative: 7 %
Neutro Abs: 7.5 10*3/uL (ref 1.7–7.7)
Neutrophils Relative %: 68 %
Platelets: 301 10*3/uL (ref 150–400)
RBC: 3.8 MIL/uL — ABNORMAL LOW (ref 4.22–5.81)
RDW: 13 % (ref 11.5–15.5)
WBC: 11 10*3/uL — ABNORMAL HIGH (ref 4.0–10.5)
nRBC: 0 % (ref 0.0–0.2)

## 2023-09-14 LAB — BASIC METABOLIC PANEL
Anion gap: 6 (ref 5–15)
BUN: 12 mg/dL (ref 6–20)
CO2: 25 mmol/L (ref 22–32)
Calcium: 9 mg/dL (ref 8.9–10.3)
Chloride: 101 mmol/L (ref 98–111)
Creatinine, Ser: 0.86 mg/dL (ref 0.61–1.24)
GFR, Estimated: >60 mL/min (ref >60)
Glucose, Bld: 120 mg/dL — ABNORMAL HIGH (ref 70–99)
Potassium: 4.2 mmol/L (ref 3.5–5.1)
Sodium: 132 mmol/L — ABNORMAL LOW (ref 135–145)

## 2023-09-14 LAB — RESP PANEL BY RT-PCR (RSV, FLU A&B, COVID)  RVPGX2
Influenza A by PCR: NEGATIVE
Influenza B by PCR: NEGATIVE
Resp Syncytial Virus by PCR: NEGATIVE
SARS Coronavirus 2 by RT PCR: NEGATIVE

## 2023-09-14 MED ORDER — PREDNISONE 20 MG PO TABS: 40.0000 mg | ORAL_TABLET | Freq: Every day | ORAL | 0 refills | Status: AC

## 2023-09-14 MED ORDER — IPRATROPIUM-ALBUTEROL 0.5-2.5 (3) MG/3ML IN SOLN: 3.0000 mL | RESPIRATORY_TRACT | 2 refills | Status: AC | PRN

## 2023-09-14 MED ORDER — ALBUTEROL SULFATE (2.5 MG/3ML) 0.083% IN NEBU
2.5000 mg | INHALATION_SOLUTION | Freq: Once | RESPIRATORY_TRACT | Status: AC
  Administered 2023-09-14: 2.5 mg via RESPIRATORY_TRACT
  Filled 2023-09-14: qty 3

## 2023-09-14 MED ORDER — METHYLPREDNISOLONE SODIUM SUCC 125 MG IJ SOLR
125.0000 mg | Freq: Once | INTRAMUSCULAR | Status: AC
  Administered 2023-09-14: 125 mg via INTRAVENOUS
  Filled 2023-09-14: qty 2

## 2023-09-14 MED ORDER — ALBUTEROL SULFATE HFA 108 (90 BASE) MCG/ACT IN AERS
2.0000 | INHALATION_SPRAY | RESPIRATORY_TRACT | Status: DC | PRN
  Administered 2023-09-14: 2 via RESPIRATORY_TRACT
  Filled 2023-09-14: qty 6.7

## 2023-09-14 MED ORDER — IPRATROPIUM-ALBUTEROL 0.5-2.5 (3) MG/3ML IN SOLN
3.0000 mL | RESPIRATORY_TRACT | Status: AC
  Administered 2023-09-14 (×3): 3 mL via RESPIRATORY_TRACT
  Filled 2023-09-14: qty 9

## 2023-09-14 NOTE — Discharge Instructions (Signed)
You were seen for your asthma exacerbation in the emergency department.   At home, please use your inhalers for any wheezing.  Please take the steroids we have prescribed you for your asthma exacerbation as well.    Check your MyChart online for the results of your COVID test that had not resulted by the time you left the emergency department.   Follow-up with your primary doctor in 2-3 days regarding your visit.    Return immediately to the emergency department if you experience any of the following: Difficulty breathing, chest pain, or any other concerning symptoms.    Thank you for visiting our Emergency Department. It was a pleasure taking care of you today.

## 2023-09-14 NOTE — ED Provider Notes (Signed)
[facility] EMERGENCY DEPARTMENT AT [facility] Provider Note   CSN: [id number] Arrival date & time: 09/14/23  4098     History  Chief Complaint  Patient presents with   Shortness of Breath    Louis Scott is a 37 y.o. male.  37 year old male with a history of opiate use on methadone and asthma who presents to the emergency department shortness of breath.  Yesterday started feeling short of breath.  Says that he has felt like his chest is congested but is not coughing up anything.  Has tried nebulizer treatments at home and over-the-counter mucolytic's without improvement of his symptoms.  Also has had some congestion.  No intubations for his asthma.       Home Medications Prior to Admission medications   Medication Sig Start Date End Date Taking? Authorizing Provider  ipratropium-albuterol (DUONEB) 0.5-2.5 (3) MG/3ML SOLN Take 3 mLs by nebulization every 4 (four) hours as needed for up to 30 doses. 09/14/23  Yes Rondel Baton, MD  predniSONE (DELTASONE) 20 MG tablet Take 2 tablets (40 mg total) by mouth daily for 4 days. 09/14/23 09/18/23 Yes Rondel Baton, MD  albuterol (VENTOLIN HFA) 108 (90 Base) MCG/ACT inhaler Inhale 2 puffs into the lungs every 4 (four) hours as needed for wheezing or shortness of breath. 01/25/21   Eber Hong, MD  benzonatate (TESSALON) 100 MG capsule Take 1 capsule (100 mg total) by mouth every 8 (eight) hours. 01/25/21   Eber Hong, MD  methadone (DOLOPHINE) 10 MG/5ML solution Take 120 mg by mouth daily.    [provider]  methocarbamol (ROBAXIN) 500 MG tablet Take 1 tablet (500 mg total) by mouth every 12 (twelve) hours as needed for muscle spasms. 12/19/22   Antony Madura, PA-C  naproxen (NAPROSYN) 500 MG tablet Take 1 tablet (500 mg total) by mouth 2 (two) times daily as needed for mild pain or moderate pain. 12/19/22   Antony Madura, PA-C  ondansetron (ZOFRAN ODT) 4 MG disintegrating tablet Take 1 tablet (4 mg total) by  mouth every 8 (eight) hours as needed for nausea or vomiting. Patient not taking: No sig reported 03/16/16   Everlene Farrier, PA-C  dicyclomine (BENTYL) 20 MG tablet Take 1 tablet (20 mg total) by mouth 3 (three) times daily before meals. As needed for abdominal pain Patient not taking: Reported on 12/26/2014 10/21/14 12/26/14  Ward, Layla Maw, DO  Linaclotide (LINZESS) 145 MCG CAPS capsule Take 1 capsule (145 mcg total) by mouth daily. Take 30 minutes before breakfast 12/22/15 02/10/16  Gelene Mink, NP  ranitidine (ZANTAC) 150 MG capsule Take 1 capsule (150 mg total) by mouth daily. Patient not taking: No sig reported 03/16/16 01/25/21  Everlene Farrier, PA-C      Allergies    Patient has no known allergies.    Review of Systems   Review of Systems  Physical Exam Updated Vital Signs BP 124/77   Pulse (!) 106   Temp 98.1 F (36.7 C) (Oral)   Resp 19   Ht 5\' 4"  (1.626 m)   Wt 72.6 kg   SpO2 92%   BMI 27.46 kg/m  Physical Exam Vitals and nursing note reviewed.  Constitutional:      General: He is not in acute distress.    Appearance: He is well-developed.  HENT:     Head: Normocephalic and atraumatic.     Right Ear: External ear normal.     Left Ear: External ear normal.  Nose: Nose normal.  Eyes:     Extraocular Movements: Extraocular movements intact.     Conjunctiva/sclera: Conjunctivae normal.     Pupils: Pupils are equal, round, and reactive to light.  Cardiovascular:     Rate and Rhythm: Normal rate and regular rhythm.     Heart sounds: Normal heart sounds.  Pulmonary:     Effort: Pulmonary effort is normal. No respiratory distress.     Breath sounds: Wheezing (Diffuse expiratory) present.  Musculoskeletal:     Cervical back: Normal range of motion and neck supple.     Right lower leg: No edema.     Left lower leg: No edema.  Skin:    General: Skin is warm and dry.  Neurological:     Mental Status: He is alert. Mental status is at baseline.  Psychiatric:         Mood and Affect: Mood normal.        Behavior: Behavior normal.     ED Results / Procedures / Treatments   Labs (all labs ordered are listed, but only abnormal results are displayed) Labs Reviewed  BASIC METABOLIC PANEL - Abnormal; Notable for the following components:      Result Value   Sodium 132 (*)    Glucose, Bld 120 (*)    All other components within normal limits  CBC WITH DIFFERENTIAL/PLATELET - Abnormal; Notable for the following components:   WBC 11.0 (*)    RBC 3.80 (*)    Hemoglobin 11.5 (*)    HCT 33.3 (*)    Eosinophils Absolute 0.9 (*)    All other components within normal limits  RESP PANEL BY RT-PCR (RSV, FLU A&B, COVID)  [id number]    EKG EKG Interpretation Date/Time:  Friday September 14 2023 13:44:03 EST Ventricular Rate:  92 PR Interval:  212 QRS Duration:  119 QT Interval:  403 QTC Calculation: 416 R Axis:   -35  Text Interpretation: Incomplete analysis due to missing data in precordial lead(s) Sinus rhythm Ventricular bigeminy Prolonged PR interval Incomplete left bundle branch block Borderline low voltage, extremity leads ST elev, probable normal early repol pattern Missing lead(s): V3 Confirmed by Vonita Moss [id number]) on 09/14/2023 4:19:47 PM  Radiology DG Chest 2 View  Result Date: 09/14/2023 CLINICAL DATA:  Shortness of breath. EXAM: CHEST - 2 VIEW COMPARISON:  Chest radiographs 05/27/2021, 10/20/2014 FINDINGS: Cardiac silhouette and mediastinal contours are within normal limits. The pulmonary vasculature appears unchanged multiple prior radiographs. Unchanged tiny likely calcified benign granuloma overlying the inferior medial right lung. The lungs are clear. No pleural effusion or pneumothorax. Mild multilevel degenerative disc changes of the thoracic spine. IMPRESSION: No active cardiopulmonary disease. Electronically Signed   By: Neita Garnet M.D.   On: 09/14/2023 09:10    Procedures Procedures    Medications Ordered in ED Medications   albuterol (PROVENTIL) (2.5 MG/3ML) 0.083% nebulizer solution 2.5 mg (2.5 mg Nebulization Given 09/14/23 0909)  methylPREDNISolone sodium succinate (SOLU-MEDROL) 125 mg/2 mL injection 125 mg (125 mg Intravenous Given 09/14/23 0938)  ipratropium-albuterol (DUONEB) 0.5-2.5 (3) MG/3ML nebulizer solution 3 mL (3 mLs Nebulization Given 09/14/23 0935)    ED Course/ Medical Decision Making/ A&P                                 Medical Decision Making Amount and/or Complexity of Data Reviewed Labs: ordered. Radiology: ordered.  Risk Prescription drug management.   VALLEY MENELEY is  a 37 y.o. male with comorbidities that complicate the patient evaluation including opiate abuse on methadone and asthma who presents emergency department shortness of breath  Initial Ddx:  Asthma exacerbation, URI, pneumonia, PE  MDM:  Feel the patient likely has an asthma exacerbation based on their symptoms.  Does have expiratory wheezing on exam but does not appear to be in distress.  Will obtain a COVID and flu to assess for URI.  Will also obtain a chest x-ray to evaluate for pneumonia or any other causes of their shortness of breath.  Given their findings on lung auscultation feel that the cause is likely due to obstructive airway disease rather than a pulmonary embolism.  Plan:  Labs Chest x-ray COVID/flu Steroids Nebs  ED Summary/Re-evaluation:  Patient's labs returned.  COVID and flu were negative.  Chest x-ray without pneumonia.  Had mild leukocytosis which I feel is due to the stress of this illness and it may have been precipitated by URI.  Patient was feeling much better after the steroid and nebulizer.  Satting well on room air.  Will have him follow-up with his primary doctor in several days and give him prednisone as well as albuterol to take at home.  This patient presents to the ED for concern of complaints listed in HPI, this involves an extensive number of treatment options, and is a complaint  that carries with it a high risk of complications and morbidity. Disposition including potential need for admission considered.   Dispo: DC Home. Return precautions discussed including, but not limited to, those listed in the AVS. Allowed pt time to ask questions which were answered fully prior to dc.  Records reviewed Outpatient Clinic Notes The following labs were independently interpreted: Chemistry and show no acute abnormality I independently reviewed the following imaging with scope of interpretation limited to determining acute life threatening conditions related to emergency care: Chest x-ray and agree with the radiologist interpretation with the following exceptions: none I personally reviewed and interpreted cardiac monitoring: normal sinus rhythm  and sinus tachycardia I personally reviewed and interpreted the pt's EKG: see above for interpretation  I have reviewed the patients home medications and made adjustments as needed        Final Clinical Impression(s) / ED Diagnoses Final diagnoses:  Exacerbation of asthma, unspecified asthma severity, unspecified whether persistent    Rx / DC Orders ED Discharge Orders          Ordered    ipratropium-albuterol (DUONEB) 0.5-2.5 (3) MG/3ML SOLN  Every 4 hours PRN        09/14/23 1014    predniSONE (DELTASONE) 20 MG tablet  Daily        09/14/23 1014              Rondel Baton, MD 09/14/23 2031

## 2023-09-14 NOTE — ED Triage Notes (Signed)
Pt c/o sob that has gotten progressively worse since yesterday  Pt has been using inhaler and neb treatments at home with no significant relief

## 2023-09-14 NOTE — ED Notes (Signed)
Pt back from x-ray.

## 2023-09-14 NOTE — ED Notes (Signed)
Pt states he feels better after the breathing treatment.
